# Patient Record
Sex: Male | Born: 1945 | Race: White | Hispanic: No | Marital: Married | Smoking: Former smoker
Health system: Southern US, Community
[De-identification: ages and names within clinical notes are randomized; demographics above are authoritative.]

## PROBLEM LIST (undated history)

## (undated) DIAGNOSIS — E785 Hyperlipidemia, unspecified: Secondary | ICD-10-CM

## (undated) DIAGNOSIS — I10 Essential (primary) hypertension: Secondary | ICD-10-CM

## (undated) DIAGNOSIS — I739 Peripheral vascular disease, unspecified: Secondary | ICD-10-CM

## (undated) DIAGNOSIS — M1990 Unspecified osteoarthritis, unspecified site: Secondary | ICD-10-CM

## (undated) DIAGNOSIS — K5900 Constipation, unspecified: Secondary | ICD-10-CM

## (undated) DIAGNOSIS — I219 Acute myocardial infarction, unspecified: Secondary | ICD-10-CM

## (undated) HISTORY — PX: HERNIA REPAIR: SHX51

## (undated) HISTORY — DX: Hyperlipidemia, unspecified: E78.5

## (undated) HISTORY — DX: Peripheral vascular disease, unspecified: I73.9

## (undated) HISTORY — DX: Acute myocardial infarction, unspecified: I21.9

## (undated) HISTORY — PX: OTHER SURGICAL HISTORY: SHX169

## (undated) HISTORY — DX: Essential (primary) hypertension: I10

---

## 1974-11-18 HISTORY — PX: FRACTURE SURGERY: SHX138

## 1988-11-18 HISTORY — PX: CARDIAC CATHETERIZATION: SHX172

## 2012-10-07 ENCOUNTER — Encounter (INDEPENDENT_AMBULATORY_CARE_PROVIDER_SITE_OTHER): Payer: Medicare Other

## 2012-10-07 ENCOUNTER — Other Ambulatory Visit: Payer: Self-pay | Admitting: Cardiology

## 2012-10-07 DIAGNOSIS — I739 Peripheral vascular disease, unspecified: Secondary | ICD-10-CM

## 2012-10-19 ENCOUNTER — Encounter: Payer: Self-pay | Admitting: Vascular Surgery

## 2012-10-21 ENCOUNTER — Encounter: Payer: Self-pay | Admitting: Vascular Surgery

## 2012-10-22 ENCOUNTER — Encounter: Payer: Self-pay | Admitting: Vascular Surgery

## 2012-10-22 ENCOUNTER — Ambulatory Visit (INDEPENDENT_AMBULATORY_CARE_PROVIDER_SITE_OTHER): Payer: Medicare Other | Admitting: Vascular Surgery

## 2012-10-22 VITALS — BP 128/78 | HR 52 | Resp 14 | Ht 68.0 in | Wt 151.0 lb

## 2012-10-22 DIAGNOSIS — I739 Peripheral vascular disease, unspecified: Secondary | ICD-10-CM | POA: Insufficient documentation

## 2012-10-22 DIAGNOSIS — I70219 Atherosclerosis of native arteries of extremities with intermittent claudication, unspecified extremity: Secondary | ICD-10-CM | POA: Insufficient documentation

## 2012-10-22 MED ORDER — PREGABALIN 50 MG PO CAPS: 50.0000 mg | ORAL_CAPSULE | Freq: Three times a day (TID) | ORAL | Status: DC
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
## 2012-10-28 ENCOUNTER — Encounter (HOSPITAL_COMMUNITY): Payer: Self-pay | Admitting: Pharmacy Technician

## 2012-10-29 ENCOUNTER — Other Ambulatory Visit: Payer: Self-pay

## 2012-11-06 ENCOUNTER — Telehealth: Payer: Self-pay | Admitting: Vascular Surgery

## 2012-11-06 ENCOUNTER — Ambulatory Visit (HOSPITAL_COMMUNITY)
Admission: RE | Admit: 2012-11-06 | Discharge: 2012-11-06 | Disposition: A | Discharge status: Home / Self Care | Payer: Medicare Other | Source: Ambulatory Visit | Attending: Vascular Surgery | Admitting: Vascular Surgery

## 2012-11-06 ENCOUNTER — Encounter (HOSPITAL_COMMUNITY)
Admission: RE | Disposition: A | Discharge status: Home / Self Care | Payer: Self-pay | Source: Ambulatory Visit | Attending: Vascular Surgery

## 2012-11-06 DIAGNOSIS — I70219 Atherosclerosis of native arteries of extremities with intermittent claudication, unspecified extremity: Secondary | ICD-10-CM

## 2012-11-06 DIAGNOSIS — I714 Abdominal aortic aneurysm, without rupture: Secondary | ICD-10-CM

## 2012-11-06 DIAGNOSIS — I10 Essential (primary) hypertension: Secondary | ICD-10-CM | POA: Insufficient documentation

## 2012-11-06 DIAGNOSIS — G609 Hereditary and idiopathic neuropathy, unspecified: Secondary | ICD-10-CM | POA: Insufficient documentation

## 2012-11-06 HISTORY — PX: ABDOMINAL AORTAGRAM: SHX5454

## 2012-11-06 LAB — POCT I-STAT, CHEM 8
BUN: 14 mg/dL (ref 6–23)
Chloride: 107 mEq/L (ref 96–112)
Creatinine, Ser: 1.5 mg/dL — ABNORMAL HIGH (ref 0.50–1.35)
Sodium: 142 mEq/L (ref 135–145)

## 2012-11-06 SURGERY — ABDOMINAL AORTAGRAM
Anesthesia: LOCAL

## 2012-11-06 MED ORDER — MORPHINE SULFATE 10 MG/ML IJ SOLN: 2.0000 mg | INTRAMUSCULAR | Status: DC | PRN

## 2012-11-06 MED ORDER — ACETAMINOPHEN 325 MG PO TABS: 325.0000 mg | ORAL_TABLET | ORAL | Status: DC | PRN

## 2012-11-06 MED ORDER — LIDOCAINE HCL (PF) 1 % IJ SOLN
INTRAMUSCULAR | Status: AC
  Filled 2012-11-06: qty 30

## 2012-11-06 MED ORDER — SODIUM CHLORIDE 0.9 % IV SOLN
INTRAVENOUS | Status: DC
  Administered 2012-11-06: 06:00:00 via INTRAVENOUS

## 2012-11-06 MED ORDER — METOPROLOL TARTRATE 1 MG/ML IV SOLN: 2.0000 mg | INTRAVENOUS | Status: DC | PRN

## 2012-11-06 MED ORDER — HYDRALAZINE HCL 20 MG/ML IJ SOLN: 10.0000 mg | INTRAMUSCULAR | Status: DC | PRN

## 2012-11-06 MED ORDER — ONDANSETRON HCL 4 MG/2ML IJ SOLN: 4.0000 mg | Freq: Four times a day (QID) | INTRAMUSCULAR | Status: DC | PRN

## 2012-11-06 MED ORDER — MIDAZOLAM HCL 2 MG/2ML IJ SOLN
INTRAMUSCULAR | Status: AC
  Filled 2012-11-06: qty 2

## 2012-11-06 MED ORDER — ACETAMINOPHEN 325 MG RE SUPP: 325.0000 mg | RECTAL | Status: DC | PRN

## 2012-11-06 MED ORDER — FENTANYL CITRATE 0.05 MG/ML IJ SOLN
INTRAMUSCULAR | Status: AC
  Filled 2012-11-06: qty 2

## 2012-11-06 MED ORDER — SODIUM CHLORIDE 0.45 % IV SOLN: INTRAVENOUS | Status: DC

## 2012-11-06 MED ORDER — OXYCODONE HCL 5 MG PO TABS: 5.0000 mg | ORAL_TABLET | ORAL | Status: DC | PRN

## 2012-11-06 MED ORDER — LABETALOL HCL 5 MG/ML IV SOLN: 10.0000 mg | INTRAVENOUS | Status: DC | PRN

## 2012-11-06 NOTE — Op Note (Signed)
Procedure: Aortogram with bilateral lower extremity runoff  Preoperative diagnosis: Claudication  A separate diagnosis: Same  Anesthesia: Local with IV sedation  Operative findings: #1 no significant right superficial femoral artery occlusive disease #250% stenosis left superficial femoral artery #3 two-vessel runoff to the right and left foot via a diseased peroneal and posterior tibial artery   Operative details: After informed consent, the patient was taken to the PV lab. The patient was placed in supine position on the Angio table. Both groins were prepped and draped in the usual sterile fashion. Local anesthesia was infiltrated over the rightt common femoral artery. An introducer needle was used to cannulate the right common femoral artery without difficulty. An 13 Versacore wire was then threaded and the abdominal aorta under fluoroscopic guidance. The 5 French sheath was thoroughly flushed with heparinized saline. A 5 French pigtail catheter was then placed over the guidewire and advanced into the abdominal aorta under fluoroscopic guidance. Abdominal aortogram was obtained in an AP projection. The left and right renal arteries are patent. The infrarenal abdominal aorta is irregular but no significant flow-limiting stenosis. There is some dilation and suggestion of abdominal aortic aneurysm with calcification along the lateral aspect of the infrarenal aorta. The left and right common iliac arteries are patent. The left and right external and internal iliac arteries are patent.  Next the pigtail catheter was pulled down above the aortic bifurcation and pelvic angiogram was obtained. This confirms the above findings. Bilateral lower extremity runoff views were then obtained.  In the right leg, the right common femoral and profunda femoris artery is patent. The right superficial femoral artery is irregular on its surface but no significant flow-limiting stenosis. The popliteal artery is patent. The  anterior tibial artery is occluded. There is two-vessel runoff via a very diseased peroneal artery and the posterior tibial artery. The posterior tibial artery is the primary runoff vessel to the right foot.  In the left leg, the leftt common femoral and profunda femoris artery is patent. The left superficial femoral artery is irregular on its surface there are 2 tandem stenoses new the adductor hiatus both of which are approximately 50%. The popliteal artery is patent. The anterior tibial artery is occluded. There is one-vessel runoff via a very diseased peroneal artery. The posterior tibial artery is is patent proximally but occludes just above the ankle.  Next the catheter was removed over a guidewire. The 5 French sheath was thoroughly flushed with heparinized saline. This was left in place to be pulled in the holding area. The patient tolerated the procedure well and there were no complications. Patient was taken to the holding area in stable condition.  Management plan:  The patient was advised to quit smoking.  We will obtain an aortic ultrasound today to further examine his aorta for possible AAA.  He will continue his current therapy for neuropathy.  Fabienne Bruns, MD [facility] [place] Office: [phone] Pager: [phone]

## 2012-11-06 NOTE — Interval H&P Note (Signed)
History and Physical Interval Note:  11/06/2012 7:37 AM  Nicholas Morrison  has presented today for surgery, with the diagnosis of pvd  The various methods of treatment have been discussed with the patient and family. After consideration of risks, benefits and other options for treatment, the patient has consented to  Procedure(s) (LRB) with comments: ABDOMINAL AORTAGRAM (N/A) as a surgical intervention .  The patient's history has been reviewed, patient examined, no change in status, stable for surgery.  I have reviewed the patient's chart and labs.  Questions were answered to the patient's satisfaction.     Jubilee Vivero E

## 2012-11-06 NOTE — Telephone Encounter (Addendum)
Message copied by Shari Prows on Fri Nov 06, 2012 10:51 AM ------      Message from: Melene Plan      Created: Fri Nov 06, 2012  9:14 AM                   ----- Message -----         From: Sherren Kerns, MD         Sent: 11/06/2012   8:22 AM           To: Reuel Derby, Melene Plan, RN            Aortogram with bilat runoff            Needs follow up ABI in 6 mo and appt Gillian Scarce I scheduled an appt for the above patient on 05/06/13 at 11:30am for abi's and to see NP at 12 noon. I spoke to the pt's wife regarding the appt and also mailed an appt letter. awt
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
## 2013-01-08 ENCOUNTER — Encounter: Payer: Self-pay | Admitting: Neurology

## 2013-01-08 DIAGNOSIS — F17200 Nicotine dependence, unspecified, uncomplicated: Secondary | ICD-10-CM | POA: Insufficient documentation

## 2013-01-08 DIAGNOSIS — I10 Essential (primary) hypertension: Secondary | ICD-10-CM | POA: Insufficient documentation

## 2013-03-03 ENCOUNTER — Ambulatory Visit: Payer: Self-pay | Admitting: Neurology

## 2013-04-14 ENCOUNTER — Telehealth: Payer: Self-pay | Admitting: Vascular Surgery

## 2013-04-14 NOTE — Telephone Encounter (Signed)
Called Nicholas Morrison to change the time of his appointment on 05/06/13.  His wife stated he longer will see Dr. Darrick Penna and hung up before I could question.

## 2013-05-06 ENCOUNTER — Ambulatory Visit: Payer: Medicare Other | Admitting: Neurosurgery

## 2013-05-31 IMAGING — US US AORTA
1 series · 14 of 25 positions shown · non-contrast
Comparison: None.

CLINICAL DATA: Possible abdominal aortic aneurysm noted during
cardiac catheterization.

ULTRASOUND OF ABDOMINAL AORTA
TECHNIQUE: Ultrasound examination of the abdominal aorta was
performed to evaluate for abdominal aortic aneurysm.

[Series 1: us aorta · 0.23mm/px · 14 of 25 slices shown]
[im 1/25]
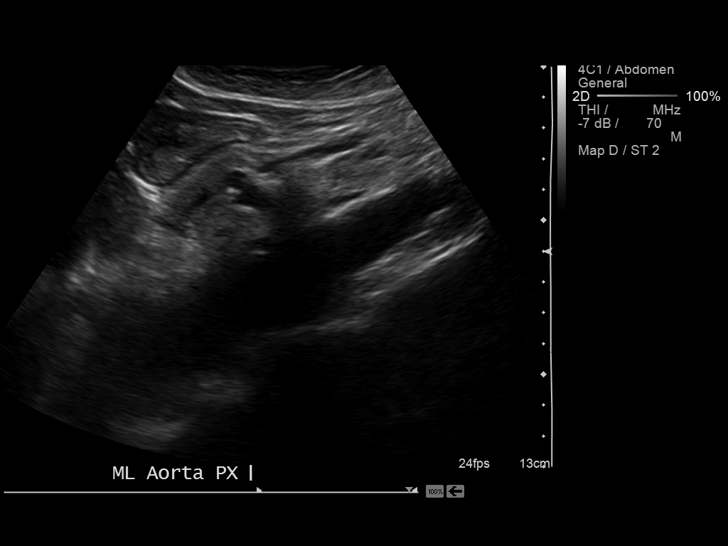
[im 3/25]
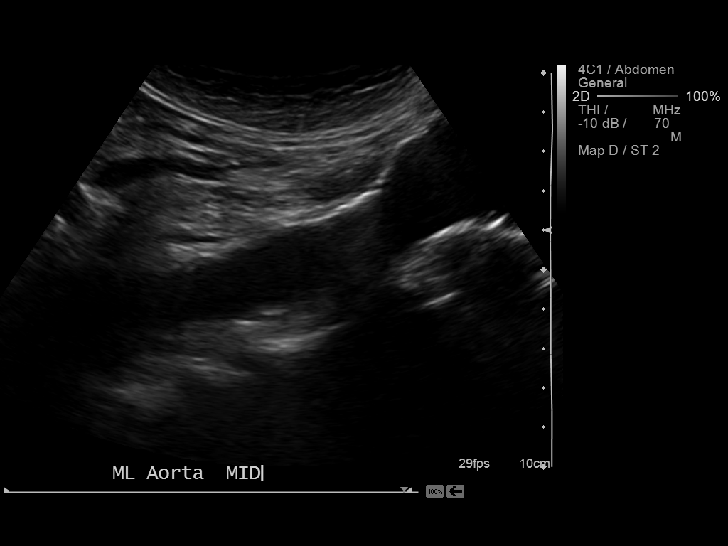
[im 5/25]
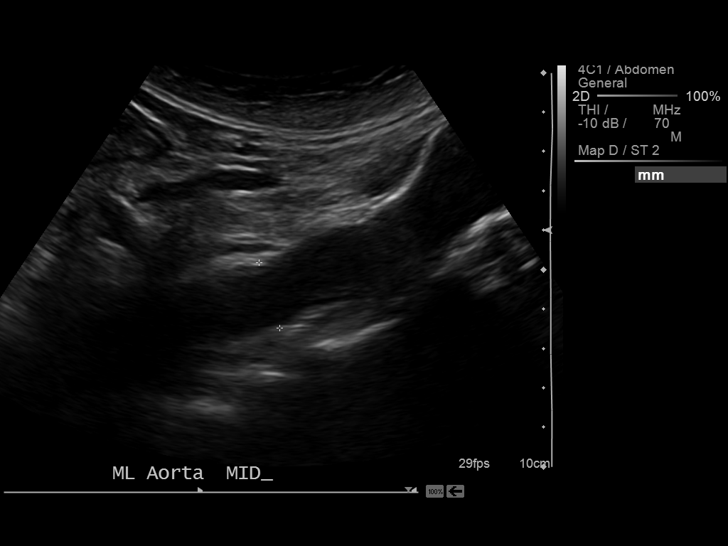
[im 7/25]
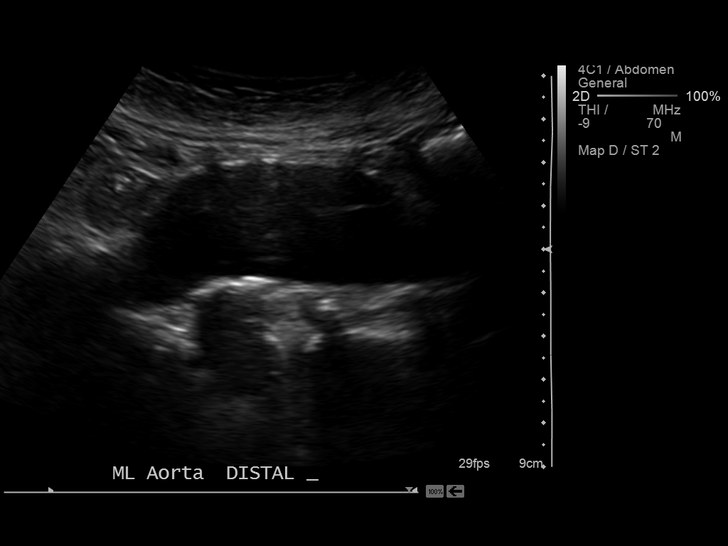
[im 9/25]
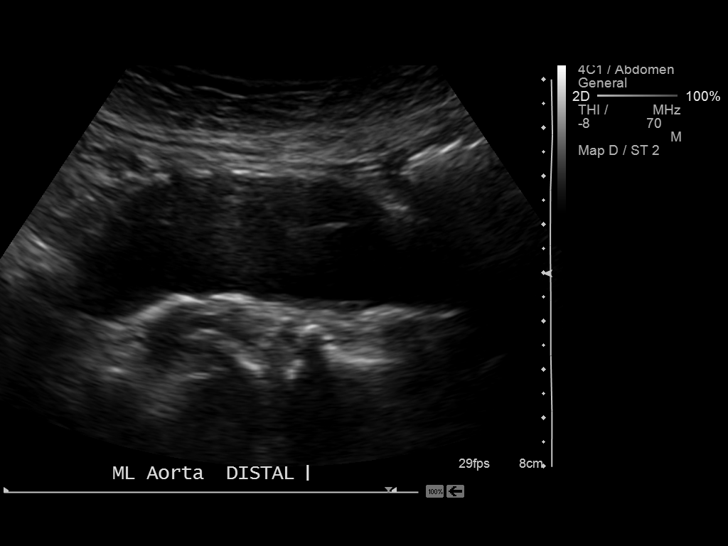
[im 10/25]
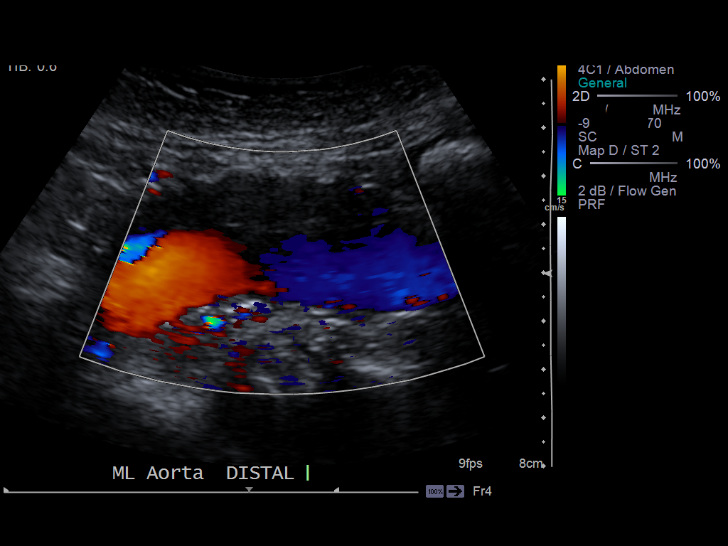
[im 12/25]
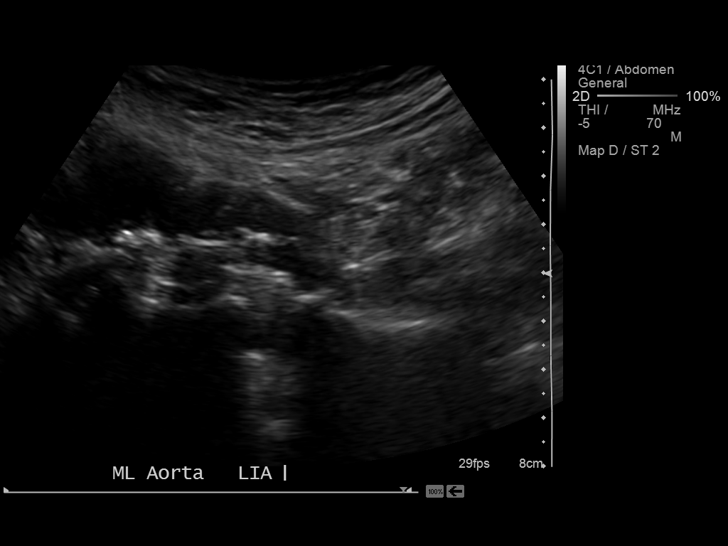
[im 14/25]
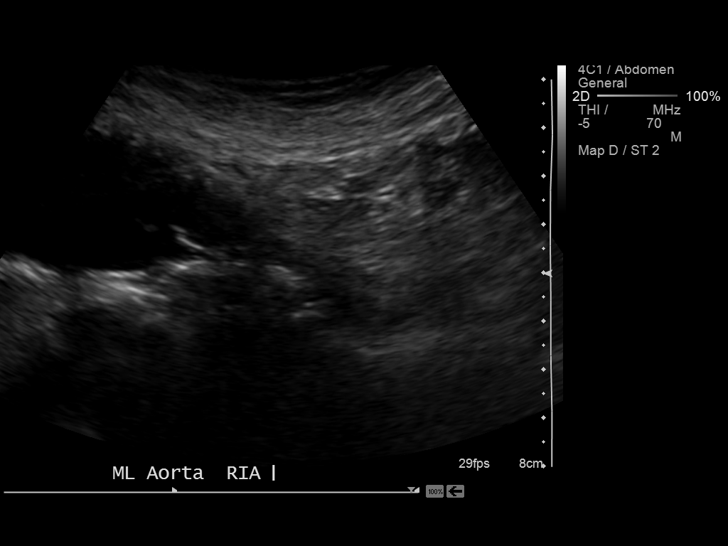
[im 16/25]
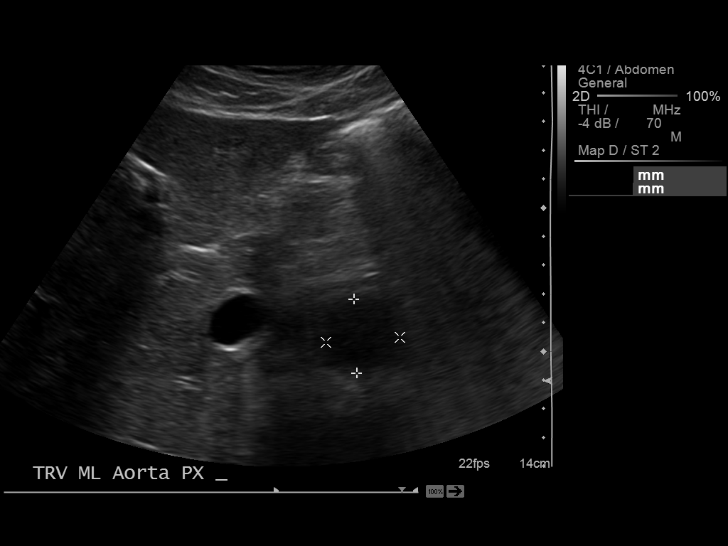
[im 17/25]
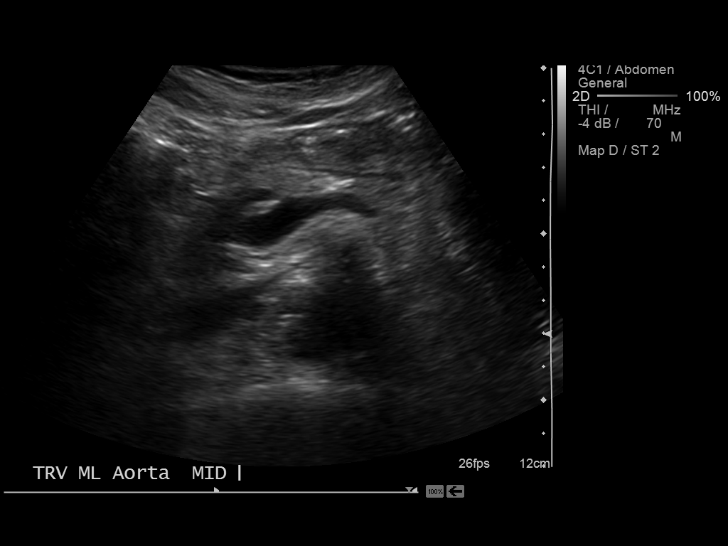
[im 19/25]
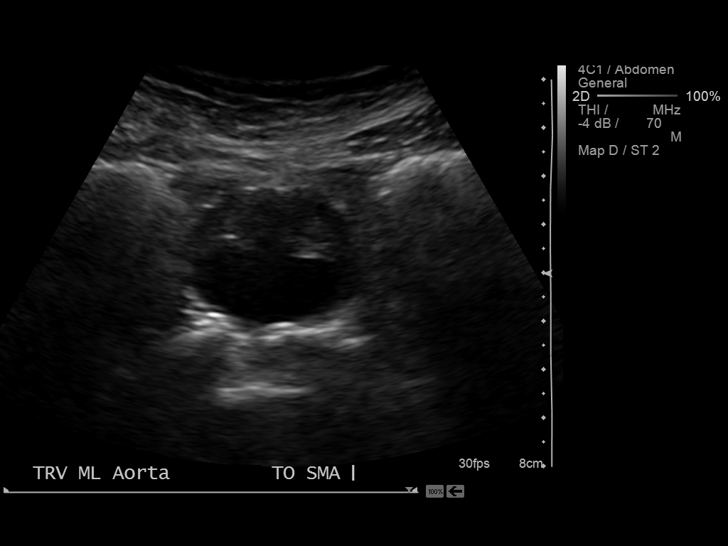
[im 21/25]
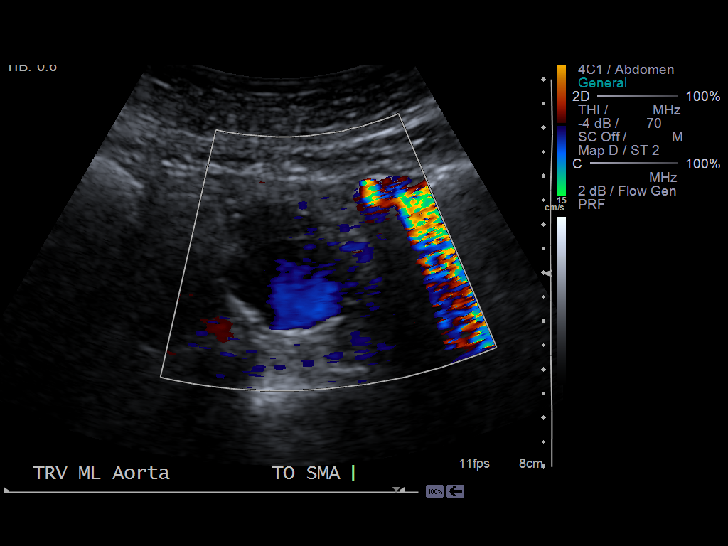
[im 23/25]
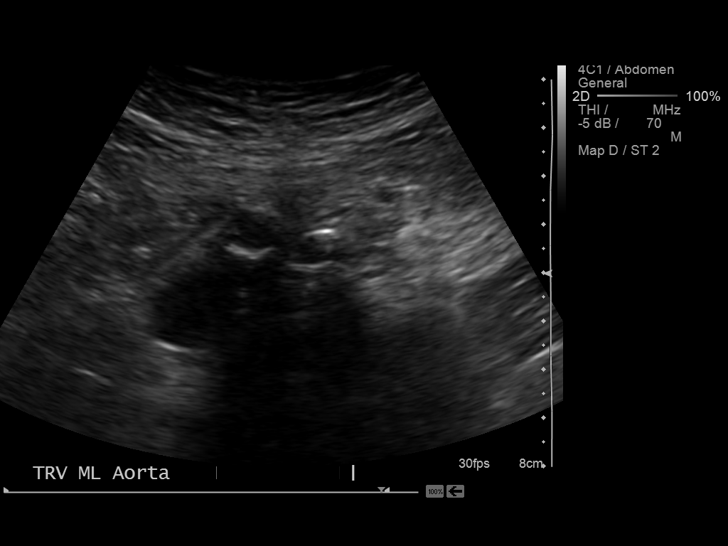
[im 25/25]
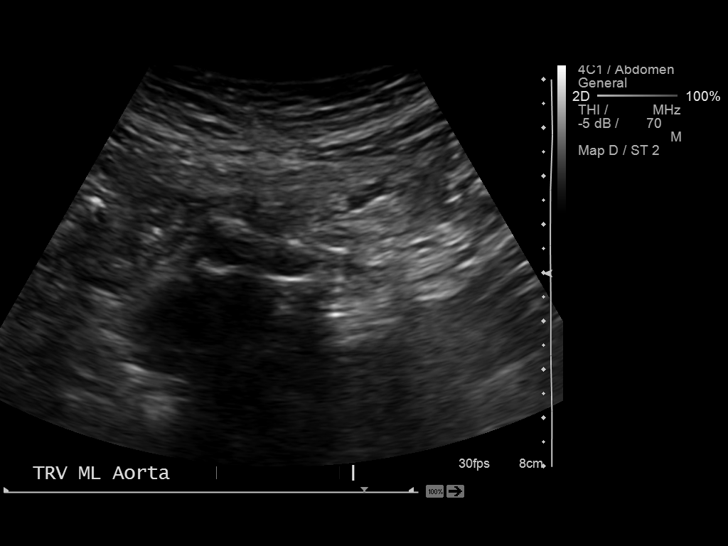

[14 of 25 positions shown; findings below may reference images not displayed]

Abdominal Aorta:  Long segment abdominal aortic aneurysm spanning
over 6.4 cm in length.  Prominent plaque along the periphery most
notable anteriorly.

      Maximum AP diameter:  3.4 cm.
      Maximum TRV diameter:  3.6 cm.

Right common iliac artery maximal dimension 1.1 cm.

Left common iliac artery maximal dimension 0.8 cm.
IMPRESSION: Long segment abdominal aortic aneurysm with maximal transverse
dimension of 3.4 x 3.6 cm with prominent plaque most notable in the
anterior aspect of the aneurysm.

This has been Myrielis Nales Perez report utilizing dashboard call
feature.

## 2014-02-23 ENCOUNTER — Encounter: Payer: Self-pay | Admitting: Family

## 2014-02-23 ENCOUNTER — Other Ambulatory Visit: Payer: Self-pay | Admitting: Vascular Surgery

## 2014-02-23 DIAGNOSIS — L98499 Non-pressure chronic ulcer of skin of other sites with unspecified severity: Principal | ICD-10-CM

## 2014-02-23 DIAGNOSIS — I739 Peripheral vascular disease, unspecified: Secondary | ICD-10-CM

## 2014-02-24 ENCOUNTER — Encounter: Payer: Self-pay | Admitting: Family

## 2014-02-24 ENCOUNTER — Other Ambulatory Visit: Payer: Self-pay | Admitting: *Deleted

## 2014-02-24 ENCOUNTER — Ambulatory Visit (INDEPENDENT_AMBULATORY_CARE_PROVIDER_SITE_OTHER): Payer: Medicare Other | Admitting: Family

## 2014-02-24 ENCOUNTER — Encounter (HOSPITAL_COMMUNITY): Payer: Self-pay | Admitting: Pharmacy Technician

## 2014-02-24 ENCOUNTER — Ambulatory Visit (HOSPITAL_COMMUNITY)
Admission: RE | Admit: 2014-02-24 | Discharge: 2014-02-24 | Disposition: A | Discharge status: Home / Self Care | Payer: Medicare Other | Source: Ambulatory Visit | Attending: Family | Admitting: Family

## 2014-02-24 ENCOUNTER — Encounter: Payer: Self-pay | Admitting: *Deleted

## 2014-02-24 VITALS — BP 115/72 | HR 72 | Temp 97.7°F | Resp 16 | Ht 68.0 in | Wt 152.0 lb

## 2014-02-24 DIAGNOSIS — I70219 Atherosclerosis of native arteries of extremities with intermittent claudication, unspecified extremity: Secondary | ICD-10-CM

## 2014-02-24 DIAGNOSIS — I7025 Atherosclerosis of native arteries of other extremities with ulceration: Secondary | ICD-10-CM | POA: Insufficient documentation

## 2014-02-24 DIAGNOSIS — I739 Peripheral vascular disease, unspecified: Secondary | ICD-10-CM

## 2014-02-24 DIAGNOSIS — L98499 Non-pressure chronic ulcer of skin of other sites with unspecified severity: Secondary | ICD-10-CM
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
## 2014-02-24 NOTE — Patient Instructions (Signed)
Peripheral Vascular Disease Peripheral Vascular Disease (PVD), also called Peripheral Arterial Disease (PAD), is a circulation problem caused by cholesterol (atherosclerotic plaque) deposits in the arteries. PVD commonly occurs in the lower extremities (legs) but it can occur in other areas of the body, such as your arms. The cholesterol buildup in the arteries reduces blood flow which can cause pain and other serious problems. The presence of PVD can place a person at risk for Coronary Artery Disease (CAD).  CAUSES  Causes of PVD can be many. It is usually associated with more than one risk factor such as:   High Cholesterol.  Smoking.  Diabetes.  Lack of exercise or inactivity.  High blood pressure (hypertension).  Obesity.  Family history. SYMPTOMS   When the lower extremities are affected, patients with PVD may experience:  Leg pain with exertion or physical activity. This is called INTERMITTENT CLAUDICATION. This may present as cramping or numbness with physical activity. The location of the pain is associated with the level of blockage. For example, blockage at the abdominal level (distal abdominal aorta) may result in buttock or hip pain. Lower leg arterial blockage may result in calf pain.  As PVD becomes more severe, pain can develop with less physical activity.  In people with severe PVD, leg pain may occur at rest.  Other PVD signs and symptoms:  Leg numbness or weakness.  Coldness in the affected leg or foot, especially when compared to the other leg.  A change in leg color.  Patients with significant PVD are more prone to ulcers or sores on toes, feet or legs. These may take longer to heal or may reoccur. The ulcers or sores can become infected.  If signs and symptoms of PVD are ignored, gangrene may occur. This can result in the loss of toes or loss of an entire limb.  Not all leg pain is related to PVD. Other medical conditions can cause leg pain such  as:  Blood clots (embolism) or Deep Vein Thrombosis.  Inflammation of the blood vessels (vasculitis).  Spinal stenosis. DIAGNOSIS  Diagnosis of PVD can involve several different types of tests. These can include:  Pulse Volume Recording Method (PVR). This test is simple, painless and does not involve the use of X-rays. PVR involves measuring and comparing the blood pressure in the arms and legs. An ABI (Ankle-Brachial Index) is calculated. The normal ratio of blood pressures is 1. As this number becomes smaller, it indicates more severe disease.  < 0.95  indicates significant narrowing in one or more leg vessels.  <0.8 there will usually be pain in the foot, leg or buttock with exercise.  <0.4 will usually have pain in the legs at rest.  <0.25  usually indicates limb threatening PVD.  Doppler detection of pulses in the legs. This test is painless and checks to see if you have a pulses in your legs/feet.  A dye or contrast material (a substance that highlights the blood vessels so they show up on x-ray) may be given to help your caregiver better see the arteries for the following tests. The dye is eliminated from your body by the kidney's. Your caregiver may order blood work to check your kidney function and other laboratory values before the following tests are performed:  Magnetic Resonance Angiography (MRA). An MRA is a picture study of the blood vessels and arteries. The MRA machine uses a large magnet to produce images of the blood vessels.  Computed Tomography Angiography (CTA). A CTA is a   specialized x-ray that looks at how the blood flows in your blood vessels. An IV may be inserted into your arm so contrast dye can be injected.  Angiogram. Is a procedure that uses x-rays to look at your blood vessels. This procedure is minimally invasive, meaning a small incision (cut) is made in your groin. A small tube (catheter) is then inserted into the artery of your groin. The catheter is  guided to the blood vessel or artery your caregiver wants to examine. Contrast dye is injected into the catheter. X-rays are then taken of the blood vessel or artery. After the images are obtained, the catheter is taken out. TREATMENT  Treatment of PVD involves many interventions which may include:  Lifestyle changes:  Quitting smoking.  Exercise.  Following a low fat, low cholesterol diet.  Control of diabetes.  Foot care is very important to the PVD patient. Good foot care can help prevent infection.  Medication:  Cholesterol-lowering medicine.  Blood pressure medicine.  Anti-platelet drugs.  Certain medicines may reduce symptoms of Intermittent Claudication.  Interventional/Surgical options:  Angioplasty. An Angioplasty is a procedure that inflates a balloon in the blocked artery. This opens the blocked artery to improve blood flow.  Stent Implant. A wire mesh tube (stent) is placed in the artery. The stent expands and stays in place, allowing the artery to remain open.  Peripheral Bypass Surgery. This is a surgical procedure that reroutes the blood around a blocked artery to help improve blood flow. This type of procedure may be performed if Angioplasty or stent implants are not an option. SEEK IMMEDIATE MEDICAL CARE IF:   You develop pain or numbness in your arms or legs.  Your arm or leg turns cold, becomes blue in color.  You develop redness, warmth, swelling and pain in your arms or legs. MAKE SURE YOU:   Understand these instructions.  Will watch your condition.  Will get help right away if you are not doing well or get worse. Document Released: 12/12/2004 Document Revised: 01/27/2012 Document Reviewed: 11/08/2008 ExitCare Patient Information 2014 [employer].   Smoking Cessation Quitting smoking is important to your health and has many advantages. However, it is not always easy to quit since nicotine is a very addictive drug. Often times, people try 3  times or more before being able to quit. This document explains the best ways for you to prepare to quit smoking. Quitting takes hard work and a lot of effort, but you can do it. ADVANTAGES OF QUITTING SMOKING  You will live longer, feel better, and live better.  Your body will feel the impact of quitting smoking almost immediately.  Within 20 minutes, blood pressure decreases. Your pulse returns to its normal level.  After 8 hours, carbon monoxide levels in the blood return to normal. Your oxygen level increases.  After 24 hours, the chance of having a heart attack starts to decrease. Your breath, hair, and body stop smelling like smoke.  After 48 hours, damaged nerve endings begin to recover. Your sense of taste and smell improve.  After 72 hours, the body is virtually free of nicotine. Your bronchial tubes relax and breathing becomes easier.  After 2 to 12 weeks, lungs can hold more air. Exercise becomes easier and circulation improves.  The risk of having a heart attack, stroke, cancer, or lung disease is greatly reduced.  After 1 year, the risk of coronary heart disease is cut in half.  After 5 years, the risk of stroke falls to   the same as a nonsmoker.  After 10 years, the risk of lung cancer is cut in half and the risk of other cancers decreases significantly.  After 15 years, the risk of coronary heart disease drops, usually to the level of a nonsmoker.  If you are pregnant, quitting smoking will improve your chances of having a healthy baby.  The people you live with, especially any children, will be healthier.  You will have extra money to spend on things other than cigarettes. QUESTIONS TO THINK ABOUT BEFORE ATTEMPTING TO QUIT You may want to talk about your answers with your caregiver.  Why do you want to quit?  If you tried to quit in the past, what helped and what did not?  What will be the most difficult situations for you after you quit? How will you plan to  handle them?  Who can help you through the tough times? Your family? Friends? A caregiver?  What pleasures do you get from smoking? What ways can you still get pleasure if you quit? Here are some questions to ask your caregiver:  How can you help me to be successful at quitting?  What medicine do you think would be best for me and how should I take it?  What should I do if I need more help?  What is smoking withdrawal like? How can I get information on withdrawal? GET READY  Set a quit date.  Change your environment by getting rid of all cigarettes, ashtrays, matches, and lighters in your home, car, or work. Do not let people smoke in your home.  Review your past attempts to quit. Think about what worked and what did not. GET SUPPORT AND ENCOURAGEMENT You have a better chance of being successful if you have help. You can get support in many ways.  Tell your family, friends, and co-workers that you are going to quit and need their support. Ask them not to smoke around you.  Get individual, group, or telephone counseling and support. Programs are available at local hospitals and health centers. Call your local health department for information about programs in your area.  Spiritual beliefs and practices may help some smokers quit.  Download a "quit meter" on your computer to keep track of quit statistics, such as how long you have gone without smoking, cigarettes not smoked, and money saved.  Get a self-help book about quitting smoking and staying off of tobacco. LEARN NEW SKILLS AND BEHAVIORS  Distract yourself from urges to smoke. Talk to someone, go for a walk, or occupy your time with a task.  Change your normal routine. Take a different route to work. Drink tea instead of coffee. Eat breakfast in a different place.  Reduce your stress. Take a hot bath, exercise, or read a book.  Plan something enjoyable to do every day. Reward yourself for not smoking.  Explore  interactive web-based programs that specialize in helping you quit. GET MEDICINE AND USE IT CORRECTLY Medicines can help you stop smoking and decrease the urge to smoke. Combining medicine with the above behavioral methods and support can greatly increase your chances of successfully quitting smoking.  Nicotine replacement therapy helps deliver nicotine to your body without the negative effects and risks of smoking. Nicotine replacement therapy includes nicotine gum, lozenges, inhalers, nasal sprays, and skin patches. Some may be available over-the-counter and others require a prescription.  Antidepressant medicine helps people abstain from smoking, but how this works is unknown. This medicine is available by prescription.    Nicotinic receptor partial agonist medicine simulates the effect of nicotine in your brain. This medicine is available by prescription. Ask your caregiver for advice about which medicines to use and how to use them based on your health history. Your caregiver will tell you what side effects to look out for if you choose to be on a medicine or therapy. Carefully read the information on the package. Do not use any other product containing nicotine while using a nicotine replacement product.  RELAPSE OR DIFFICULT SITUATIONS Most relapses occur within the first 3 months after quitting. Do not be discouraged if you start smoking again. Remember, most people try several times before finally quitting. You may have symptoms of withdrawal because your body is used to nicotine. You may crave cigarettes, be irritable, feel very hungry, cough often, get headaches, or have difficulty concentrating. The withdrawal symptoms are only temporary. They are strongest when you first quit, but they will go away within 10 14 days. To reduce the chances of relapse, try to:  Avoid drinking alcohol. Drinking lowers your chances of successfully quitting.  Reduce the amount of caffeine you consume. Once you  quit smoking, the amount of caffeine in your body increases and can give you symptoms, such as a rapid heartbeat, sweating, and anxiety.  Avoid smokers because they can make you want to smoke.  Do not let weight gain distract you. Many smokers will gain weight when they quit, usually less than 10 pounds. Eat a healthy diet and stay active. You can always lose the weight gained after you quit.  Find ways to improve your mood other than smoking. FOR MORE INFORMATION  www.smokefree.gov  Document Released: 10/29/2001 Document Revised: 05/05/2012 Document Reviewed: 02/13/2012 [employer] Patient Information 2014 [employer].  

## 2014-02-27 ENCOUNTER — Encounter (HOSPITAL_COMMUNITY): Payer: Self-pay | Admitting: Emergency Medicine

## 2014-02-27 ENCOUNTER — Emergency Department (HOSPITAL_COMMUNITY): Payer: Medicare Other

## 2014-02-27 DIAGNOSIS — I739 Peripheral vascular disease, unspecified: Secondary | ICD-10-CM | POA: Insufficient documentation

## 2014-02-27 DIAGNOSIS — E875 Hyperkalemia: Secondary | ICD-10-CM | POA: Insufficient documentation

## 2014-02-27 DIAGNOSIS — T370X5A Adverse effect of sulfonamides, initial encounter: Secondary | ICD-10-CM | POA: Insufficient documentation

## 2014-02-27 DIAGNOSIS — L97509 Non-pressure chronic ulcer of other part of unspecified foot with unspecified severity: Secondary | ICD-10-CM | POA: Insufficient documentation

## 2014-02-27 DIAGNOSIS — I252 Old myocardial infarction: Secondary | ICD-10-CM | POA: Insufficient documentation

## 2014-02-27 DIAGNOSIS — Z7982 Long term (current) use of aspirin: Secondary | ICD-10-CM | POA: Insufficient documentation

## 2014-02-27 DIAGNOSIS — I10 Essential (primary) hypertension: Secondary | ICD-10-CM | POA: Insufficient documentation

## 2014-02-27 DIAGNOSIS — Z79899 Other long term (current) drug therapy: Secondary | ICD-10-CM | POA: Insufficient documentation

## 2014-02-27 DIAGNOSIS — F17200 Nicotine dependence, unspecified, uncomplicated: Secondary | ICD-10-CM | POA: Insufficient documentation

## 2014-02-27 DIAGNOSIS — R609 Edema, unspecified: Secondary | ICD-10-CM | POA: Insufficient documentation

## 2014-02-27 DIAGNOSIS — L519 Erythema multiforme, unspecified: Secondary | ICD-10-CM | POA: Insufficient documentation

## 2014-02-27 DIAGNOSIS — E785 Hyperlipidemia, unspecified: Secondary | ICD-10-CM | POA: Insufficient documentation

## 2014-02-27 DIAGNOSIS — Z9889 Other specified postprocedural states: Secondary | ICD-10-CM | POA: Insufficient documentation

## 2014-02-27 NOTE — ED Notes (Signed)
The pt has rt foot problems since February when his  Rt great toe was trimmed causing infection.  His foot has been dressed  x2 each day for awhile.  Tonight the pt has ahd more pain and his wife  Reports a significant change in the foot color plus there is redness extending up the leg that was not there this am at 1000 when last dressed.  The pt is not a diabetic

## 2014-02-28 ENCOUNTER — Emergency Department (HOSPITAL_COMMUNITY)
Admission: EM | Admit: 2014-02-28 | Discharge: 2014-02-28 | Disposition: A | Discharge status: Home / Self Care | Payer: Medicare Other | Attending: Emergency Medicine | Admitting: Emergency Medicine

## 2014-02-28 DIAGNOSIS — L519 Erythema multiforme, unspecified: Secondary | ICD-10-CM

## 2014-02-28 DIAGNOSIS — T7840XA Allergy, unspecified, initial encounter: Secondary | ICD-10-CM

## 2014-02-28 DIAGNOSIS — E875 Hyperkalemia: Secondary | ICD-10-CM

## 2014-02-28 LAB — BASIC METABOLIC PANEL
BUN: 19 mg/dL (ref 6–23)
CALCIUM: 9 mg/dL (ref 8.4–10.5)
CO2: 24 mEq/L (ref 19–32)
CREATININE: 1.31 mg/dL (ref 0.50–1.35)
Chloride: 101 mEq/L (ref 96–112)
GFR, EST AFRICAN AMERICAN: 63 mL/min — AB (ref >90)
GFR, EST NON AFRICAN AMERICAN: 54 mL/min — AB (ref >90)
Glucose, Bld: 93 mg/dL (ref 70–99)
Potassium: 5.9 mEq/L — ABNORMAL HIGH (ref 3.7–5.3)
Sodium: 137 mEq/L (ref 137–147)

## 2014-02-28 LAB — CBC WITH DIFFERENTIAL/PLATELET
Basophils Absolute: 0 10*3/uL (ref 0.0–0.1)
Basophils Relative: 0 % (ref 0–1)
Eosinophils Absolute: 0.2 10*3/uL (ref 0.0–0.7)
Eosinophils Relative: 2 % (ref 0–5)
HEMATOCRIT: 41.6 % (ref 39.0–52.0)
HEMOGLOBIN: 14.7 g/dL (ref 13.0–17.0)
LYMPHS ABS: 2 10*3/uL (ref 0.7–4.0)
Lymphocytes Relative: 23 % (ref 12–46)
MCH: 34.4 pg — ABNORMAL HIGH (ref 26.0–34.0)
MCHC: 35.3 g/dL (ref 30.0–36.0)
MCV: 97.4 fL (ref 78.0–100.0)
MONO ABS: 0.9 10*3/uL (ref 0.1–1.0)
MONOS PCT: 11 % (ref 3–12)
NEUTROS ABS: 5.4 10*3/uL (ref 1.7–7.7)
Neutrophils Relative %: 64 % (ref 43–77)
Platelets: 236 10*3/uL (ref 150–400)
RBC: 4.27 MIL/uL (ref 4.22–5.81)
RDW: 12.3 % (ref 11.5–15.5)
WBC: 8.4 10*3/uL (ref 4.0–10.5)

## 2014-02-28 LAB — PROTIME-INR
INR: 1.04 (ref 0.00–1.49)
Prothrombin Time: 13.4 seconds (ref 11.6–15.2)

## 2014-02-28 LAB — SEDIMENTATION RATE: SED RATE: 30 mm/h — AB (ref 0–16)

## 2014-02-28 MED ORDER — ONDANSETRON HCL 4 MG/2ML IJ SOLN: 4.0000 mg | Freq: Once | INTRAMUSCULAR | Status: DC

## 2014-02-28 MED ORDER — OXYCODONE-ACETAMINOPHEN 5-325 MG PO TABS: 1.0000 | ORAL_TABLET | ORAL | Status: DC | PRN

## 2014-02-28 MED ORDER — HYDROCODONE-ACETAMINOPHEN 5-325 MG PO TABS
2.0000 | ORAL_TABLET | Freq: Once | ORAL | Status: DC
  Filled 2014-02-28: qty 2

## 2014-02-28 MED ORDER — OXYCODONE-ACETAMINOPHEN 5-325 MG PO TABS
1.0000 | ORAL_TABLET | Freq: Once | ORAL | Status: AC
  Administered 2014-02-28: 1 via ORAL
  Filled 2014-02-28: qty 1

## 2014-02-28 NOTE — Discharge Instructions (Signed)
Please discontinue your bactrim immediately. Follow up with your podiatrist and let him know that you were instructed to stop this medication. Please make him aware of your rash. Continue to change your bandages as directed. Your potassium was slightly elevated. This is likely due to the bactrim as well.    Drug Allergy Allergic reactions to medicines are common. Some allergic reactions are mild. A delayed type of drug allergy that occurs 1 week or more after exposure to a medicine or vaccine is called serum sickness. A life-threatening, sudden (acute) allergic reaction that involves the whole body is called anaphylaxis. CAUSES  "True" drug allergies occur when there is an allergic reaction to a medicine. This is caused by overactivity of the immune system. First, the body becomes sensitized. The immune system is triggered by your first exposure to the medicine. Following this first exposure, future exposure to the same medicine may be life-threatening. Almost any medicine can cause an allergic reaction. Common ones are:  Penicillin.  Sulfonamides (sulfa drugs).  Local anesthetics.  X-ray dyes that contain iodine. SYMPTOMS  Common symptoms of a minor allergic reaction are:  Swelling around the mouth.  An itchy red rash or hives.  Vomiting or diarrhea. Anaphylaxis can cause swelling of the mouth and throat. This makes it difficult to breathe and swallow. Severe reactions can be fatal within seconds, even after exposure to only a trace amount of the drug that causes the reaction. HOME CARE INSTRUCTIONS   If you are unsure of what caused your reaction, keep a diary of foods and medicines used. Include the symptoms that followed. Avoid anything that causes reactions.  You may want to follow up with an allergy specialist after the reaction has cleared in order to be tested to confirm the allergy. It is important to confirm that your reaction is an allergy, not just a side effect to the  medicine. If you have a true allergy to a medicine, this may prevent that medicine and related medicines from being given to you when you are very ill.  If you have hives or a rash:  Take medicines as directed by your caregiver.  You may use an over-the-counter antihistamine (diphenhydramine) as needed.  Apply cold compresses to the skin or take baths in cool water. Avoid hot baths or showers.  If you are severely allergic:  Continuous observation after a severe reaction may be needed. Hospitalization is often required.  Wear a medical alert bracelet or necklace stating your allergy.  You and your family must learn how to use an anaphylaxis kit or give an epinephrine injection to temporarily treat an emergency allergic reaction. If you have had a severe reaction, always carry your epinephrine injection or anaphylaxis kit with you. This can be lifesaving if you have a severe reaction.  Do not drive or perform tasks after treatment until the medicines used to treat your reaction have worn off, or until your caregiver says it is okay. SEEK MEDICAL CARE IF:   You think you had an allergic reaction. Symptoms usually start within 30 minutes after exposure.  Symptoms are getting worse rather than better.  You develop new symptoms.  The symptoms that brought you to your caregiver return. SEEK IMMEDIATE MEDICAL CARE IF:   You have swelling of the mouth, difficulty breathing, or wheezing.  You have a tight feeling in your chest or throat.  You develop hives, swelling, or itching all over your body.  You develop severe vomiting or diarrhea.  You feel  faint or pass out. This is an emergency. Use your epinephrine injection or anaphylaxis kit as you have been instructed. Call for emergency medical help. Even if you improve after the injection, you need to be examined at a hospital emergency department. MAKE SURE YOU:   Understand these instructions.  Will watch your condition.  Will  get help right away if you are not doing well or get worse. Document Released: 11/04/2005 Document Revised: 01/27/2012 Document Reviewed: 04/10/2011 [facility] Patient Information 2014 [place], [place].  Erythema Multiforme Erythema multiforme (EM) is a rash that occurs mostly on the skin. Sometimes it occurs on the lips and mouth. It is usually a mild illness that goes away on its own. It usually affects young adults in the spring and fall. It tends to be recurrent with each episode lasting 1 to 4 weeks. CAUSES  The cause of EM may be an overreaction by the body's immune system to a trigger (something that causes the body to react).  Common triggers include:  Infections, including:  Viruses.  Bacteria.  Fungi.  Parasites.  Medicines. Less common triggers include:  Foods.  Chemicals.  Injuries to the skin.  Pregnancy.  Other illnesses. In some cases the cause may not be known. SYMPTOMS  The rash from EM shows up suddenly. The rash may appear days after the trigger. It may start as small, red, round or oval marks that become bumps or raised welts over 24 to 48 hours. These can spread and be quite large (about one inch [several centimeters]). These skin changes usually appear first on the backs of the hands, then spread to the tops of the feet, arms, elbows, knees, palms and soles. There may be a mild rash on the lips and lining of the mouth. The skin rash may show up in waves over a few days. There may be mild itching or burning of the skin at first. It may take up to 4 weeks to go away. The rash may come back again at a later time. DIAGNOSIS  Diagnosis of EM is usually made by physical exam. Sometimes a skin biopsy is done if the diagnosis is not certain. A skin biopsy is the removal of a small piece of tissue which can be examined under a microscope by a specialist (pathologist). TREATMENT  Most episodes of EM heal on their own and treatment may not be needed. If possible, it is  best to remove the trigger or treat the infection. If your trigger is a herpes virus infection (cold sore), use sunscreen lotion and sunscreen-containing lip balm to prevent sunlight triggered outbreaks of herpes virus. Medicine for itching may be given. Medicines can be used for severe cases and to prevent repeat bouts of EM.  HOME CARE INSTRUCTIONS   If possible, avoid known triggers.  If a medicine was your trigger, be sure to notify all of your caregivers. You should avoid this medicine or any like it in the future. SEEK MEDICAL CARE IF:   Your EM rash shows up again in the future SEEK IMMEDIATE MEDICAL CARE IF:   Red, swollen lips or mouth develop.  Burning feeling in the mouth or lips.  Blisters or open sores in the mouth, lips, vagina, penis or anus.  Eye pain, redness or drainage.  Blisters on the skin.  Difficulty breathing.  Difficulty swallowing; drooling.  Blood in urine.  Pain with urinating. Document Released: 11/04/2005 Document Revised: 01/27/2012 Document Reviewed: 10/21/2008 [facility] Patient Information 2014 [place], [place].

## 2014-02-28 NOTE — ED Notes (Signed)
Dr Otter at bedside  

## 2014-02-28 NOTE — ED Notes (Signed)
Pt reports his wife will drive him home.
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
## 2014-03-01 MED ORDER — SODIUM CHLORIDE 0.9 % IV SOLN
INTRAVENOUS | Status: DC
  Administered 2014-03-02: 100 mL/h via INTRAVENOUS

## 2014-03-02 ENCOUNTER — Other Ambulatory Visit: Payer: Self-pay

## 2014-03-02 ENCOUNTER — Encounter (HOSPITAL_COMMUNITY)
Admission: RE | Disposition: A | Discharge status: Home / Self Care | Payer: Self-pay | Source: Ambulatory Visit | Attending: Vascular Surgery

## 2014-03-02 ENCOUNTER — Ambulatory Visit (HOSPITAL_COMMUNITY)
Admission: RE | Admit: 2014-03-02 | Discharge: 2014-03-02 | Disposition: A | Discharge status: Home / Self Care | Payer: Medicare Other | Source: Ambulatory Visit | Attending: Vascular Surgery | Admitting: Vascular Surgery

## 2014-03-02 DIAGNOSIS — I252 Old myocardial infarction: Secondary | ICD-10-CM | POA: Insufficient documentation

## 2014-03-02 DIAGNOSIS — L97509 Non-pressure chronic ulcer of other part of unspecified foot with unspecified severity: Secondary | ICD-10-CM | POA: Insufficient documentation

## 2014-03-02 DIAGNOSIS — I70269 Atherosclerosis of native arteries of extremities with gangrene, unspecified extremity: Secondary | ICD-10-CM | POA: Insufficient documentation

## 2014-03-02 DIAGNOSIS — I739 Peripheral vascular disease, unspecified: Secondary | ICD-10-CM

## 2014-03-02 DIAGNOSIS — L98499 Non-pressure chronic ulcer of skin of other sites with unspecified severity: Secondary | ICD-10-CM

## 2014-03-02 DIAGNOSIS — Z01810 Encounter for preprocedural cardiovascular examination: Secondary | ICD-10-CM

## 2014-03-02 DIAGNOSIS — F17200 Nicotine dependence, unspecified, uncomplicated: Secondary | ICD-10-CM | POA: Insufficient documentation

## 2014-03-02 DIAGNOSIS — E785 Hyperlipidemia, unspecified: Secondary | ICD-10-CM | POA: Insufficient documentation

## 2014-03-02 DIAGNOSIS — Z7982 Long term (current) use of aspirin: Secondary | ICD-10-CM | POA: Insufficient documentation

## 2014-03-02 DIAGNOSIS — I10 Essential (primary) hypertension: Secondary | ICD-10-CM | POA: Insufficient documentation

## 2014-03-02 HISTORY — PX: ABDOMINAL AORTAGRAM: SHX5454

## 2014-03-02 LAB — POCT I-STAT, CHEM 8
BUN: 14 mg/dL (ref 6–23)
CHLORIDE: 101 meq/L (ref 96–112)
CREATININE: 1.2 mg/dL (ref 0.50–1.35)
Calcium, Ion: 1.19 mmol/L (ref 1.13–1.30)
GLUCOSE: 87 mg/dL (ref 70–99)
HEMATOCRIT: 49 % (ref 39.0–52.0)
Hemoglobin: 16.7 g/dL (ref 13.0–17.0)
Potassium: 4.2 mEq/L (ref 3.7–5.3)
Sodium: 142 mEq/L (ref 137–147)
TCO2: 27 mmol/L (ref 0–100)

## 2014-03-02 SURGERY — ABDOMINAL AORTAGRAM
Anesthesia: LOCAL

## 2014-03-02 MED ORDER — HYDRALAZINE HCL 20 MG/ML IJ SOLN: 10.0000 mg | INTRAMUSCULAR | Status: DC | PRN

## 2014-03-02 MED ORDER — OXYCODONE HCL 5 MG PO TABS: 5.0000 mg | ORAL_TABLET | ORAL | Status: DC | PRN

## 2014-03-02 MED ORDER — LIDOCAINE HCL (PF) 1 % IJ SOLN
INTRAMUSCULAR | Status: AC
  Filled 2014-03-02: qty 30

## 2014-03-02 MED ORDER — ONDANSETRON HCL 4 MG/2ML IJ SOLN: 4.0000 mg | Freq: Four times a day (QID) | INTRAMUSCULAR | Status: DC | PRN

## 2014-03-02 MED ORDER — METOPROLOL TARTRATE 1 MG/ML IV SOLN: 2.0000 mg | INTRAVENOUS | Status: DC | PRN

## 2014-03-02 MED ORDER — DOCUSATE SODIUM 100 MG PO CAPS: 100.0000 mg | ORAL_CAPSULE | Freq: Every day | ORAL | Status: DC

## 2014-03-02 MED ORDER — SODIUM CHLORIDE 0.45 % IV SOLN
INTRAVENOUS | Status: DC
Start: 2014-03-02 — End: 2014-03-02
  Administered 2014-03-02: 17:00:00 via INTRAVENOUS

## 2014-03-02 MED ORDER — LABETALOL HCL 5 MG/ML IV SOLN: 10.0000 mg | INTRAVENOUS | Status: DC | PRN

## 2014-03-02 MED ORDER — MORPHINE SULFATE 10 MG/ML IJ SOLN: 2.0000 mg | INTRAMUSCULAR | Status: DC | PRN

## 2014-03-02 MED ORDER — HEPARIN (PORCINE) IN NACL 2-0.9 UNIT/ML-% IJ SOLN
INTRAMUSCULAR | Status: AC
  Filled 2014-03-02: qty 1500

## 2014-03-02 MED ORDER — ACETAMINOPHEN 325 MG PO TABS: 325.0000 mg | ORAL_TABLET | ORAL | Status: DC | PRN

## 2014-03-02 MED ORDER — FENTANYL CITRATE 0.05 MG/ML IJ SOLN
INTRAMUSCULAR | Status: AC
  Filled 2014-03-02: qty 2

## 2014-03-02 MED ORDER — ACETAMINOPHEN 325 MG RE SUPP: 325.0000 mg | RECTAL | Status: DC | PRN
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
## 2014-03-02 NOTE — Op Note (Signed)
Procedure: Abdominal aortogram with bilateral lower extremity runoff  Preoperative diagnosis: Gangrene right foot  Postoperative diagnosis: Same  Anesthesia: Local with IV sedation  Operative findings: #1 severe tibial artery occlusive disease bilaterally, posterior tibial peroneal and anterior tibial runoff to the right foot reconstituted mid leg #2 similar findings left leg  Operative details: After obtaining informed consent, the patient was taken to the PV lab. The patient was placed in supine position the Angio table. Both groins were prepped and draped in usual sterile fashion. Local anesthesia was insured of the left common femoral artery. Ultrasound was used to identify the left common femoral artery. An introducer needle was used to cannulate the left common femoral artery without difficulty. An 035 first core wire was then threaded up and the abdominal aorta under fluoroscopic guidance. A 5 French sheath was placed over the guidewire the left common femoral artery. The sheath was thoroughly flushed with heparinized saline. A 5 French pigtail catheter was placed over the guidewire up into the abdominal aorta. An abdominal aortogram was obtained. The infrarenal abdominal aorta is ectatic. It is patent. The left and right renal arteries are patent. The left and right common and external iliac arteries are patent. The internal iliac arteries are patent bilaterally but diseased and multiple areas of stenosis distally.  Next the pigtail catheter was pulled down just above the aortic bifurcation and pelvic angiogram was performed to confirm the above findings. Lower extremity runoff views were then obtained by the pigtail catheter.  In the right lower extremity, the right common femoral profunda femoris and superficial femoral arteries are patent. There were several areas of 30% stenosis to 40% stenosis within the right superficial femoral artery. The distal popliteal artery is occluded. There is  reconstitution of the peroneal and posterior tibial arteries proximally via geniculate collaterals. These are in continuity from the mid leg down to the foot. The anterior tibial artery is occluded proximally. It reconstitutes via collaterals just above the ankle and does feel dorsalis pedis artery. There are similar findings in the left leg. Additionally, there is a high takeoff of the profundal on the left side. This comes off basically at the level of the femoral head.   At this point the pigtail catheter was removed over guidewire and a 5 [place] crossover catheter was brought up in the operative field and used to selectively catheterize the right common iliac artery. An 035 angled Glidewire was then advanced down to the distal right external iliac artery. 5 [place] crossover catheter was removed and replaced with a 5 French straight catheter. Right lower extremity arteriogram was obtained to obtain additional views of the distal right leg. This again showed occlusion of the below-knee popliteal artery. There is reconstitution of the peroneal and posterior tibial arteries in the proximal leg. The posterior tibial artery is the dominant runoff vessel to the right foot. The anterior tibial artery does reconstitute via collaterals several centimeters above the ankle and fills the dorsalis pedis artery.  At this point the 5 French straight catheter was removed over guidewire. An oblique view of the left common femoral artery was obtained which again confirmed a high takeoff of the profunda femoris artery. The needle insertion site however was just above the femoral bifurcation of the femoral head.  The 5 French sheath was left in place to be pulled in the holding area.  The patient tolerated the procedure well and there were no complications. The patient was taken to the holding area in stable condition.  Operative management: The patient will be scheduled for a right femoral to posterior tibial artery  bypass on Monday, April 20. I discussed with the patient today that he is still a very high risk of limb loss even with revascularization due to the extent of the wound on his right foot. Risks benefits possible complications and procedure details the posterior tibial artery bypass were discussed the patient today. He understands and agrees to proceed.  Fabienne Brunsharles Ruqayya Ventress, MD [facility] [place] Office: [phone] Pager: [phone]

## 2014-03-02 NOTE — Discharge Instructions (Signed)
Arteriogram °Care After °These instructions give you information on caring for yourself after your procedure. Your doctor may also give you more specific instructions. Call your doctor if you have any problems or questions after your procedure. °HOME CARE °· Stay in bed the rest of the day. °· Keep your leg straight for at least 6 hours. °· Do not lift anything heavier than 10 pounds (about a gallon of milk) for 2 days. °· Do not walk a lot, run, or drive for 2 days. °· Return to normal activities in 2 days or as told by your doctor. °Finding out the results of your test °Ask when your test results will be ready. Make sure you get your test results. °GET HELP RIGHT AWAY IF:  °· You have fever of 102° F (38.9° C) or higher. °· You have more pain in your leg. °· The leg that was cut is: °· Bleeding. °· Puffy (swollen) or red. °· Cold. °· Pale or changes color. °· Weak. °· Tingly or numb. °If you go to the Emergency Room, tell your nurse that you have had an arteriogram. Take this paper with you to show the nurse. °MAKE SURE YOU: °· Understand these instructions. °· Will watch your condition. °· Will get help right away if you are not doing well or get worse. °Document Released: 01/31/2009 Document Revised: 01/27/2012 Document Reviewed: 01/31/2009 °Exit[employer]® Patient Information ©2014 [employer]. ° °

## 2014-03-02 NOTE — Interval H&P Note (Signed)
History and Physical Interval Note:  03/02/2014 8:15 AM  Nicholas Morrison  has presented today for surgery, with the diagnosis of pad/non healing ulcer  The various methods of treatment have been discussed with the patient and family. After consideration of risks, benefits and other options for treatment, the patient has consented to  Procedure(s): ABDOMINAL AORTAGRAM (N/A)  With runoff and possible intervention.  The patient's history has been reviewed, patient examined, no change in status, stable for surgery.  I have reviewed the patient's chart and labs.  Questions were answered to the patient's satisfaction.     Sherren Kernsharles E Fields

## 2014-03-02 NOTE — Progress Notes (Signed)
VASCULAR LAB PRELIMINARY  PRELIMINARY  PRELIMINARY  PRELIMINARY  Right Lower Extremity Vein Map    Right Great Saphenous Vein   Segment Diameter Comment  1. Origin 6.2 mm   2. High Thigh 4.2 mm   3. Mid Thigh 2.4 mm Non compressible  4. Low Thigh mm    Unable to follow   5. At Knee 2.1 mm   6. High Calf 2.9 mm   7. Low Calf 2.5 mm   8. Ankle 2.mm Multiple branches with  wall thickening    Left Lower Extremity Vein Map    4.2Left Great Saphenous Vein   Segment Diameter Comment  1. Origin 4.2 mm   2. High Thigh 3.7 mm   3. Mid Thigh mm Not seen  4. Low Thigh mm Not seen  5. At Knee mm Not seen  6. High Calf mm Not seen  7. Low Calf mm Not seen   8. Ankle 1.8 mm      Technically difficult study Unable to visualize segments listed in chart above   [place] Nicholas Morrison, Nicholas Morrison 03/02/2014, 6:30 PM

## 2014-03-03 ENCOUNTER — Other Ambulatory Visit: Payer: Self-pay

## 2014-03-03 NOTE — ED Provider Notes (Signed)
Medical screening examination/treatment/procedure(s) were conducted as a shared visit with non-physician practitioner(s) and myself.  I personally evaluated the patient during the encounter.  Pt with rash after starting bactrim, suspect drug reaction, EM.  Pt to stop bactrim, has f/u tomorrow.   EKG Interpretation None       Olivia Mackielga M Amarie Viles, MD 03/03/14 [phone]

## 2014-03-04 ENCOUNTER — Encounter (HOSPITAL_COMMUNITY)
Admission: RE | Admit: 2014-03-04 | Discharge: 2014-03-04 | Disposition: A | Discharge status: Home / Self Care | Payer: Medicare Other | Source: Ambulatory Visit | Attending: Vascular Surgery | Admitting: Vascular Surgery

## 2014-03-04 ENCOUNTER — Encounter (HOSPITAL_COMMUNITY): Payer: Self-pay

## 2014-03-04 DIAGNOSIS — Z01812 Encounter for preprocedural laboratory examination: Secondary | ICD-10-CM | POA: Insufficient documentation

## 2014-03-04 HISTORY — DX: Constipation, unspecified: K59.00

## 2014-03-04 HISTORY — DX: Unspecified osteoarthritis, unspecified site: M19.90

## 2014-03-04 LAB — URINALYSIS, ROUTINE W REFLEX MICROSCOPIC
Bilirubin Urine: NEGATIVE
Glucose, UA: NEGATIVE mg/dL
KETONES UR: NEGATIVE mg/dL
Leukocytes, UA: NEGATIVE
Nitrite: NEGATIVE
PH: 6 (ref 5.0–8.0)
Protein, ur: NEGATIVE mg/dL
SPECIFIC GRAVITY, URINE: 1.01 (ref 1.005–1.030)
Urobilinogen, UA: 1 mg/dL (ref 0.0–1.0)

## 2014-03-04 LAB — COMPREHENSIVE METABOLIC PANEL
ALT: 39 U/L (ref 0–53)
AST: 37 U/L (ref 0–37)
Albumin: 3.1 g/dL — ABNORMAL LOW (ref 3.5–5.2)
Alkaline Phosphatase: 193 U/L — ABNORMAL HIGH (ref 39–117)
BILIRUBIN TOTAL: 0.3 mg/dL (ref 0.3–1.2)
BUN: 15 mg/dL (ref 6–23)
CALCIUM: 8.8 mg/dL (ref 8.4–10.5)
CO2: 26 mEq/L (ref 19–32)
CREATININE: 1.12 mg/dL (ref 0.50–1.35)
Chloride: 102 mEq/L (ref 96–112)
GFR calc Af Amer: 76 mL/min — ABNORMAL LOW (ref >90)
GFR calc non Af Amer: 66 mL/min — ABNORMAL LOW (ref >90)
GLUCOSE: 90 mg/dL (ref 70–99)
Potassium: 4.3 mEq/L (ref 3.7–5.3)
Sodium: 140 mEq/L (ref 137–147)
Total Protein: 6.7 g/dL (ref 6.0–8.3)

## 2014-03-04 LAB — TYPE AND SCREEN
ABO/RH(D): A POS
ANTIBODY SCREEN: NEGATIVE

## 2014-03-04 LAB — URINE MICROSCOPIC-ADD ON

## 2014-03-04 LAB — SURGICAL PCR SCREEN
MRSA, PCR: NEGATIVE
STAPHYLOCOCCUS AUREUS: NEGATIVE

## 2014-03-04 LAB — ABO/RH: ABO/RH(D): A POS

## 2014-03-04 NOTE — Pre-Procedure Instructions (Signed)
Nicholas Morrison  03/04/2014   Your procedure is scheduled on:  Monday, April 20.              [facility], Main Entrance/ Entrance "A" at 5:30 AM.  Call this number if you have problems the morning of surgery: [phone]   Remember:   Do not eat food or drink liquids after midnight Sunday.    Take these medicines the morning of surgery with A SIP OF WATER: amLODipine (NORVASC).             Take if needed:oxyCODONE-acetaminophen (PERCOCET/ROXICET).   Do not wear jewelry, make-up or nail polish.  Do not wear lotions, powders, or perfumes.  Do not shave 48 hours prior to surgery. Men may shave face and neck.  Do not bring valuables to the hospital.  [facility] is not responsible  for any belongings or valuables.               Contacts, dentures or bridgework may not be worn into surgery.  Leave suitcase in the car. After surgery it may be brought to your room.  For patients admitted to the hospital, discharge time is determined by your treatment team.                 Special Instructions: Review  [facility] - Preparing For Surgery.   Please read over the following fact sheets that you were given: Pain Magement, Coughing and Deep Breathing, Blood Transfusion, Surgical Infection.

## 2014-03-04 NOTE — Progress Notes (Signed)
03/04/14 1103  OBSTRUCTIVE SLEEP APNEA  Have you ever been diagnosed with sleep apnea through a sleep study? No  Do you snore loudly (loud enough to be heard through closed doors)?  1  Do you often feel tired, fatigued, or sleepy during the daytime? 0  Has anyone observed you stop breathing during your sleep? 1  Do you have, or are you being treated for high blood pressure? 1  BMI more than 35 kg/m2? 0  Age over 842 years old? 1  Neck circumference greater than 40 cm/18 inches? 0 (15)  Gender: 1  Obstructive Sleep Apnea Score 5  Score 4 or greater  Results sent to PCP

## 2014-03-04 NOTE — Progress Notes (Signed)
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  Patient wanted me to get results from Dr Gweneth FritterProctors office in [place] and not have a chest xray.  ( not sure patient had a chest xray there, states he had a MRI).  Dr Proctors office ? Closed on Fri- did not get an answer, left message for the office to call with fax number and I did not receive a call.  Will need chest x-ray DOS.

## 2014-03-06 MED ORDER — DEXTROSE 5 % IV SOLN
1.5000 g | INTRAVENOUS | Status: AC
  Administered 2014-03-07: 1.5 g via INTRAVENOUS
  Filled 2014-03-06: qty 1.5

## 2014-03-06 MED ORDER — SODIUM CHLORIDE 0.9 % IV SOLN
INTRAVENOUS | Status: DC
  Administered 2014-03-07: 50 mL/h via INTRAVENOUS

## 2014-03-07 ENCOUNTER — Encounter (HOSPITAL_COMMUNITY): Payer: Medicare Other | Admitting: Anesthesiology

## 2014-03-07 ENCOUNTER — Inpatient Hospital Stay (HOSPITAL_COMMUNITY): Payer: Medicare Other

## 2014-03-07 ENCOUNTER — Encounter (HOSPITAL_COMMUNITY): Payer: Self-pay

## 2014-03-07 ENCOUNTER — Encounter (HOSPITAL_COMMUNITY)
Admission: RE | Disposition: A | Discharge status: Home Health | Payer: Self-pay | Source: Ambulatory Visit | Attending: Vascular Surgery

## 2014-03-07 ENCOUNTER — Inpatient Hospital Stay (HOSPITAL_COMMUNITY)
Admission: RE | Admit: 2014-03-07 | Discharge: 2014-03-10 | DRG: 253 | Disposition: A | Discharge status: Home Health | Payer: Medicare Other | Source: Ambulatory Visit | Attending: Vascular Surgery | Admitting: Vascular Surgery

## 2014-03-07 ENCOUNTER — Inpatient Hospital Stay (HOSPITAL_COMMUNITY): Payer: Medicare Other | Admitting: Anesthesiology

## 2014-03-07 DIAGNOSIS — E785 Hyperlipidemia, unspecified: Secondary | ICD-10-CM | POA: Diagnosis present

## 2014-03-07 DIAGNOSIS — Z79899 Other long term (current) drug therapy: Secondary | ICD-10-CM

## 2014-03-07 DIAGNOSIS — I96 Gangrene, not elsewhere classified: Secondary | ICD-10-CM | POA: Diagnosis present

## 2014-03-07 DIAGNOSIS — K5900 Constipation, unspecified: Secondary | ICD-10-CM | POA: Diagnosis present

## 2014-03-07 DIAGNOSIS — I70269 Atherosclerosis of native arteries of extremities with gangrene, unspecified extremity: Secondary | ICD-10-CM

## 2014-03-07 DIAGNOSIS — Z7982 Long term (current) use of aspirin: Secondary | ICD-10-CM

## 2014-03-07 DIAGNOSIS — I10 Essential (primary) hypertension: Secondary | ICD-10-CM | POA: Diagnosis present

## 2014-03-07 DIAGNOSIS — I739 Peripheral vascular disease, unspecified: Secondary | ICD-10-CM

## 2014-03-07 DIAGNOSIS — F17200 Nicotine dependence, unspecified, uncomplicated: Secondary | ICD-10-CM | POA: Diagnosis present

## 2014-03-07 DIAGNOSIS — I252 Old myocardial infarction: Secondary | ICD-10-CM

## 2014-03-07 DIAGNOSIS — L02619 Cutaneous abscess of unspecified foot: Secondary | ICD-10-CM | POA: Diagnosis present

## 2014-03-07 DIAGNOSIS — L03119 Cellulitis of unspecified part of limb: Secondary | ICD-10-CM

## 2014-03-07 DIAGNOSIS — L97509 Non-pressure chronic ulcer of other part of unspecified foot with unspecified severity: Secondary | ICD-10-CM | POA: Diagnosis present

## 2014-03-07 DIAGNOSIS — Z01812 Encounter for preprocedural laboratory examination: Secondary | ICD-10-CM

## 2014-03-07 DIAGNOSIS — Z8249 Family history of ischemic heart disease and other diseases of the circulatory system: Secondary | ICD-10-CM

## 2014-03-07 HISTORY — PX: FEMORAL-TIBIAL BYPASS GRAFT: SHX938

## 2014-03-07 LAB — CBC
HCT: 39.6 % (ref 39.0–52.0)
Hemoglobin: 13.3 g/dL (ref 13.0–17.0)
MCH: 33.5 pg (ref 26.0–34.0)
MCHC: 33.6 g/dL (ref 30.0–36.0)
MCV: 99.7 fL (ref 78.0–100.0)
PLATELETS: 221 10*3/uL (ref 150–400)
RBC: 3.97 MIL/uL — AB (ref 4.22–5.81)
RDW: 12.4 % (ref 11.5–15.5)
WBC: 10 10*3/uL (ref 4.0–10.5)

## 2014-03-07 LAB — CREATININE, SERUM
CREATININE: 1 mg/dL (ref 0.50–1.35)
GFR calc Af Amer: 87 mL/min — ABNORMAL LOW (ref >90)
GFR, EST NON AFRICAN AMERICAN: 75 mL/min — AB (ref >90)

## 2014-03-07 LAB — APTT: APTT: 36 s (ref 24–37)

## 2014-03-07 SURGERY — CREATION, BYPASS, ARTERIAL, FEMORAL TO TIBIAL, USING GRAFT
Anesthesia: General | Site: Leg Lower | Laterality: Right

## 2014-03-07 MED ORDER — PROTAMINE SULFATE 10 MG/ML IV SOLN
INTRAVENOUS | Status: AC
  Filled 2014-03-07: qty 5

## 2014-03-07 MED ORDER — LIDOCAINE HCL 1 % IJ SOLN
INTRAMUSCULAR | Status: DC | PRN
  Administered 2014-03-07: 100 mg via INTRADERMAL

## 2014-03-07 MED ORDER — HYDROMORPHONE HCL PF 1 MG/ML IJ SOLN
INTRAMUSCULAR | Status: AC
  Filled 2014-03-07: qty 1

## 2014-03-07 MED ORDER — SUCCINYLCHOLINE CHLORIDE 20 MG/ML IJ SOLN
INTRAMUSCULAR | Status: AC
  Filled 2014-03-07: qty 1

## 2014-03-07 MED ORDER — LIDOCAINE HCL (CARDIAC) 20 MG/ML IV SOLN
INTRAVENOUS | Status: DC | PRN
  Administered 2014-03-07: 3 mL via INTRAVENOUS

## 2014-03-07 MED ORDER — ACETAMINOPHEN 160 MG/5ML PO SOLN: 325.0000 mg | ORAL | Status: DC | PRN

## 2014-03-07 MED ORDER — ENOXAPARIN SODIUM 30 MG/0.3ML ~~LOC~~ SOLN
30.0000 mg | SUBCUTANEOUS | Status: DC
  Administered 2014-03-08 – 2014-03-09 (×2): 30 mg via SUBCUTANEOUS
  Filled 2014-03-07 (×2): qty 0.3

## 2014-03-07 MED ORDER — THROMBIN 20000 UNITS EX SOLR
CUTANEOUS | Status: AC
  Filled 2014-03-07: qty 20000

## 2014-03-07 MED ORDER — ARTIFICIAL TEARS OP OINT
TOPICAL_OINTMENT | OPHTHALMIC | Status: AC
  Filled 2014-03-07: qty 3.5

## 2014-03-07 MED ORDER — CHLORHEXIDINE GLUCONATE 4 % EX LIQD
60.0000 mL | Freq: Once | CUTANEOUS | Status: DC
  Filled 2014-03-07: qty 60

## 2014-03-07 MED ORDER — ROCURONIUM BROMIDE 50 MG/5ML IV SOLN
INTRAVENOUS | Status: AC
  Filled 2014-03-07: qty 1

## 2014-03-07 MED ORDER — SODIUM CHLORIDE 0.9 % IR SOLN
Status: DC | PRN
  Administered 2014-03-07: 09:00:00

## 2014-03-07 MED ORDER — HEPARIN SODIUM (PORCINE) 1000 UNIT/ML IJ SOLN
INTRAMUSCULAR | Status: DC | PRN
  Administered 2014-03-07: 7000 [IU] via INTRAVENOUS

## 2014-03-07 MED ORDER — EPHEDRINE SULFATE 50 MG/ML IJ SOLN
INTRAMUSCULAR | Status: DC | PRN
  Administered 2014-03-07: 10 mg via INTRAVENOUS
  Administered 2014-03-07 (×3): 5 mg via INTRAVENOUS
  Administered 2014-03-07: 10 mg via INTRAVENOUS
  Administered 2014-03-07: 15 mg via INTRAVENOUS

## 2014-03-07 MED ORDER — DEXTROSE 5 % IV SOLN
1.5000 g | Freq: Two times a day (BID) | INTRAVENOUS | Status: AC
  Administered 2014-03-07 – 2014-03-08 (×2): 1.5 g via INTRAVENOUS
  Filled 2014-03-07 (×3): qty 1.5

## 2014-03-07 MED ORDER — SODIUM CHLORIDE 0.9 % IV SOLN: INTRAVENOUS | Status: DC

## 2014-03-07 MED ORDER — HYDROMORPHONE HCL PF 1 MG/ML IJ SOLN
0.2500 mg | INTRAMUSCULAR | Status: DC | PRN
  Administered 2014-03-07 (×2): 0.25 mg via INTRAVENOUS

## 2014-03-07 MED ORDER — IOHEXOL 300 MG/ML  SOLN
INTRAMUSCULAR | Status: DC | PRN
  Administered 2014-03-07: 50 mL via INTRA_ARTERIAL

## 2014-03-07 MED ORDER — ATORVASTATIN CALCIUM 40 MG PO TABS
40.0000 mg | ORAL_TABLET | Freq: Every day | ORAL | Status: DC
  Administered 2014-03-07 – 2014-03-09 (×2): 40 mg via ORAL
  Filled 2014-03-07 (×4): qty 1

## 2014-03-07 MED ORDER — NEOSTIGMINE METHYLSULFATE 1 MG/ML IJ SOLN
INTRAMUSCULAR | Status: DC | PRN
  Administered 2014-03-07: 3 mg via INTRAVENOUS

## 2014-03-07 MED ORDER — VECURONIUM BROMIDE 10 MG IV SOLR
INTRAVENOUS | Status: DC | PRN
  Administered 2014-03-07: 3 mg via INTRAVENOUS

## 2014-03-07 MED ORDER — AMLODIPINE BESYLATE 5 MG PO TABS
5.0000 mg | ORAL_TABLET | Freq: Every day | ORAL | Status: DC
  Administered 2014-03-07 – 2014-03-09 (×3): 5 mg via ORAL
  Filled 2014-03-07 (×4): qty 1

## 2014-03-07 MED ORDER — PHENOL 1.4 % MT LIQD: 1.0000 | OROMUCOSAL | Status: DC | PRN

## 2014-03-07 MED ORDER — FENTANYL CITRATE 0.05 MG/ML IJ SOLN
INTRAMUSCULAR | Status: AC
  Filled 2014-03-07: qty 5

## 2014-03-07 MED ORDER — ROCURONIUM BROMIDE 100 MG/10ML IV SOLN
INTRAVENOUS | Status: DC | PRN
  Administered 2014-03-07: 50 mg via INTRAVENOUS

## 2014-03-07 MED ORDER — MIDAZOLAM HCL 5 MG/5ML IJ SOLN
INTRAMUSCULAR | Status: DC | PRN
  Administered 2014-03-07: 2 mg via INTRAVENOUS

## 2014-03-07 MED ORDER — HYDRALAZINE HCL 20 MG/ML IJ SOLN: 10.0000 mg | INTRAMUSCULAR | Status: DC | PRN

## 2014-03-07 MED ORDER — ONDANSETRON HCL 4 MG/2ML IJ SOLN: 4.0000 mg | Freq: Once | INTRAMUSCULAR | Status: DC | PRN

## 2014-03-07 MED ORDER — ONDANSETRON HCL 4 MG/2ML IJ SOLN
INTRAMUSCULAR | Status: AC
  Filled 2014-03-07: qty 2

## 2014-03-07 MED ORDER — PROPOFOL 10 MG/ML IV BOLUS
INTRAVENOUS | Status: AC
  Filled 2014-03-07: qty 20

## 2014-03-07 MED ORDER — 0.9 % SODIUM CHLORIDE (POUR BTL) OPTIME
TOPICAL | Status: DC | PRN
  Administered 2014-03-07 (×2): 1000 mL

## 2014-03-07 MED ORDER — ACETAMINOPHEN 325 MG PO TABS: 325.0000 mg | ORAL_TABLET | ORAL | Status: DC | PRN

## 2014-03-07 MED ORDER — PANTOPRAZOLE SODIUM 40 MG PO TBEC
40.0000 mg | DELAYED_RELEASE_TABLET | Freq: Every day | ORAL | Status: DC
  Administered 2014-03-07 – 2014-03-10 (×4): 40 mg via ORAL
  Filled 2014-03-07 (×4): qty 1

## 2014-03-07 MED ORDER — HEPARIN SODIUM (PORCINE) 1000 UNIT/ML IJ SOLN
INTRAMUSCULAR | Status: AC
  Filled 2014-03-07: qty 1

## 2014-03-07 MED ORDER — LIDOCAINE HCL (CARDIAC) 20 MG/ML IV SOLN
INTRAVENOUS | Status: AC
  Filled 2014-03-07: qty 5

## 2014-03-07 MED ORDER — LACTATED RINGERS IV SOLN
INTRAVENOUS | Status: DC | PRN
  Administered 2014-03-07 (×3): via INTRAVENOUS

## 2014-03-07 MED ORDER — MIDAZOLAM HCL 2 MG/2ML IJ SOLN
INTRAMUSCULAR | Status: AC
  Filled 2014-03-07: qty 2

## 2014-03-07 MED ORDER — ACETAMINOPHEN 650 MG RE SUPP: 325.0000 mg | RECTAL | Status: DC | PRN

## 2014-03-07 MED ORDER — NEOSTIGMINE METHYLSULFATE 1 MG/ML IJ SOLN
INTRAMUSCULAR | Status: AC
  Filled 2014-03-07: qty 10

## 2014-03-07 MED ORDER — BISACODYL 5 MG PO TBEC: 5.0000 mg | DELAYED_RELEASE_TABLET | Freq: Every day | ORAL | Status: DC | PRN

## 2014-03-07 MED ORDER — METOPROLOL TARTRATE 1 MG/ML IV SOLN: 2.0000 mg | INTRAVENOUS | Status: DC | PRN

## 2014-03-07 MED ORDER — ASPIRIN 325 MG PO TABS
325.0000 mg | ORAL_TABLET | Freq: Every day | ORAL | Status: DC
  Administered 2014-03-07 – 2014-03-10 (×4): 325 mg via ORAL
  Filled 2014-03-07 (×4): qty 1

## 2014-03-07 MED ORDER — POTASSIUM CHLORIDE CRYS ER 20 MEQ PO TBCR: 20.0000 meq | EXTENDED_RELEASE_TABLET | Freq: Every day | ORAL | Status: DC | PRN

## 2014-03-07 MED ORDER — OXYCODONE-ACETAMINOPHEN 5-325 MG PO TABS
2.0000 | ORAL_TABLET | Freq: Every evening | ORAL | Status: DC
  Administered 2014-03-07 – 2014-03-08 (×2): 2 via ORAL
  Filled 2014-03-07 (×2): qty 2

## 2014-03-07 MED ORDER — ONDANSETRON HCL 4 MG/2ML IJ SOLN
INTRAMUSCULAR | Status: DC | PRN
  Administered 2014-03-07: 4 mg via INTRAVENOUS

## 2014-03-07 MED ORDER — DOCUSATE SODIUM 100 MG PO CAPS
100.0000 mg | ORAL_CAPSULE | Freq: Every day | ORAL | Status: DC
  Administered 2014-03-08 – 2014-03-09 (×2): 100 mg via ORAL
  Filled 2014-03-07 (×3): qty 1

## 2014-03-07 MED ORDER — FENTANYL CITRATE 0.05 MG/ML IJ SOLN
INTRAMUSCULAR | Status: DC | PRN
  Administered 2014-03-07 (×2): 100 ug via INTRAVENOUS
  Administered 2014-03-07: 150 ug via INTRAVENOUS
  Administered 2014-03-07: 50 ug via INTRAVENOUS

## 2014-03-07 MED ORDER — PROPOFOL 10 MG/ML IV BOLUS
INTRAVENOUS | Status: DC | PRN
  Administered 2014-03-07: 160 mg via INTRAVENOUS

## 2014-03-07 MED ORDER — SODIUM CHLORIDE 0.9 % IV SOLN: 500.0000 mL | Freq: Once | INTRAVENOUS | Status: AC | PRN

## 2014-03-07 MED ORDER — PHENYLEPHRINE HCL 10 MG/ML IJ SOLN
INTRAMUSCULAR | Status: DC | PRN
  Administered 2014-03-07: 40 ug via INTRAVENOUS
  Administered 2014-03-07: 80 ug via INTRAVENOUS
  Administered 2014-03-07: 40 ug via INTRAVENOUS

## 2014-03-07 MED ORDER — PAPAVERINE HCL 30 MG/ML IJ SOLN
INTRAMUSCULAR | Status: AC
Start: 2014-03-07 — End: 2014-03-07
  Filled 2014-03-07: qty 2

## 2014-03-07 MED ORDER — LABETALOL HCL 5 MG/ML IV SOLN
10.0000 mg | INTRAVENOUS | Status: DC | PRN
  Filled 2014-03-07: qty 4

## 2014-03-07 MED ORDER — SODIUM CHLORIDE 0.9 % IJ SOLN
INTRAMUSCULAR | Status: AC
  Filled 2014-03-07: qty 10

## 2014-03-07 MED ORDER — GLYCOPYRROLATE 0.2 MG/ML IJ SOLN
INTRAMUSCULAR | Status: DC | PRN
  Administered 2014-03-07: .4 mg via INTRAVENOUS

## 2014-03-07 MED ORDER — EPHEDRINE SULFATE 50 MG/ML IJ SOLN
INTRAMUSCULAR | Status: AC
  Filled 2014-03-07: qty 1

## 2014-03-07 MED ORDER — ONDANSETRON HCL 4 MG/2ML IJ SOLN: 4.0000 mg | Freq: Four times a day (QID) | INTRAMUSCULAR | Status: DC | PRN

## 2014-03-07 SURGICAL SUPPLY — 71 items
BANDAGE ESMARK 6X9 LF (GAUZE/BANDAGES/DRESSINGS) IMPLANT
BANDAGE GAUZE ELAST BULKY 4 IN (GAUZE/BANDAGES/DRESSINGS) ×2 IMPLANT
BNDG ESMARK 6X9 LF (GAUZE/BANDAGES/DRESSINGS)
CANISTER SUCTION 2500CC (MISCELLANEOUS) ×2 IMPLANT
CANNULA VESSEL W/WING WO/VALVE (CANNULA) ×2 IMPLANT
CLIP TI MEDIUM 24 (CLIP) ×2 IMPLANT
CLIP TI WIDE RED SMALL 24 (CLIP) ×4 IMPLANT
CONT SPEC 4OZ CLIKSEAL STRL BL (MISCELLANEOUS) ×2 IMPLANT
COVER SURGICAL LIGHT HANDLE (MISCELLANEOUS) ×2 IMPLANT
CUFF TOURNIQUET SINGLE 24IN (TOURNIQUET CUFF) IMPLANT
CUFF TOURNIQUET SINGLE 34IN LL (TOURNIQUET CUFF) IMPLANT
CUFF TOURNIQUET SINGLE 44IN (TOURNIQUET CUFF) IMPLANT
DERMABOND ADVANCED (GAUZE/BANDAGES/DRESSINGS) ×1
DERMABOND ADVANCED .7 DNX12 (GAUZE/BANDAGES/DRESSINGS) ×1 IMPLANT
DRAIN SNY WOU (WOUND CARE) IMPLANT
DRAPE WARM FLUID 44X44 (DRAPE) ×2 IMPLANT
DRAPE X-RAY CASS 24X20 (DRAPES) IMPLANT
DRSG COVADERM 4X10 (GAUZE/BANDAGES/DRESSINGS) ×2 IMPLANT
DRSG COVADERM 4X14 (GAUZE/BANDAGES/DRESSINGS) ×4 IMPLANT
DRSG COVADERM 4X8 (GAUZE/BANDAGES/DRESSINGS) ×2 IMPLANT
DRSG EMULSION OIL 3X3 NADH (GAUZE/BANDAGES/DRESSINGS) IMPLANT
ELECT REM PT RETURN 9FT ADLT (ELECTROSURGICAL) ×2
ELECTRODE REM PT RTRN 9FT ADLT (ELECTROSURGICAL) ×1 IMPLANT
EVACUATOR SILICONE 100CC (DRAIN) IMPLANT
GAUZE SPONGE 4X4 16PLY XRAY LF (GAUZE/BANDAGES/DRESSINGS) ×2 IMPLANT
GAUZE XEROFORM 5X9 LF (GAUZE/BANDAGES/DRESSINGS) ×2 IMPLANT
GLOVE BIO SURGEON STRL SZ 6.5 (GLOVE) ×4 IMPLANT
GLOVE BIO SURGEON STRL SZ7.5 (GLOVE) ×2 IMPLANT
GLOVE BIOGEL PI IND STRL 6.5 (GLOVE) ×3 IMPLANT
GLOVE BIOGEL PI IND STRL 7.0 (GLOVE) ×4 IMPLANT
GLOVE BIOGEL PI IND STRL 7.5 (GLOVE) ×2 IMPLANT
GLOVE BIOGEL PI INDICATOR 6.5 (GLOVE) ×3
GLOVE BIOGEL PI INDICATOR 7.0 (GLOVE) ×4
GLOVE BIOGEL PI INDICATOR 7.5 (GLOVE) ×2
GLOVE ECLIPSE 6.5 STRL STRAW (GLOVE) ×4 IMPLANT
GLOVE SURG SS PI 6.5 STRL IVOR (GLOVE) ×4 IMPLANT
GLOVE SURG SS PI 7.0 STRL IVOR (GLOVE) ×2 IMPLANT
GOWN STRL REUS W/ TWL LRG LVL3 (GOWN DISPOSABLE) ×5 IMPLANT
GOWN STRL REUS W/ TWL XL LVL3 (GOWN DISPOSABLE) ×2 IMPLANT
GOWN STRL REUS W/TWL LRG LVL3 (GOWN DISPOSABLE) ×5
GOWN STRL REUS W/TWL XL LVL3 (GOWN DISPOSABLE) ×2
KIT BASIN OR (CUSTOM PROCEDURE TRAY) ×2 IMPLANT
KIT ROOM TURNOVER OR (KITS) ×2 IMPLANT
NS IRRIG 1000ML POUR BTL (IV SOLUTION) ×4 IMPLANT
PACK PERIPHERAL VASCULAR (CUSTOM PROCEDURE TRAY) ×2 IMPLANT
PAD ARMBOARD 7.5X6 YLW CONV (MISCELLANEOUS) ×4 IMPLANT
PADDING CAST COTTON 6X4 STRL (CAST SUPPLIES) IMPLANT
SET COLLECT BLD 21X3/4 12 (NEEDLE) IMPLANT
SPONGE SURGIFOAM ABS GEL 100 (HEMOSTASIS) IMPLANT
STAPLER VISISTAT 35W (STAPLE) ×4 IMPLANT
STOPCOCK 4 WAY LG BORE MALE ST (IV SETS) IMPLANT
SUT PROLENE 5 0 C 1 24 (SUTURE) ×2 IMPLANT
SUT PROLENE 6 0 BV (SUTURE) ×2 IMPLANT
SUT PROLENE 6 0 CC (SUTURE) ×4 IMPLANT
SUT PROLENE 7 0 BV 1 (SUTURE) ×2 IMPLANT
SUT PROLENE 7 0 BV1 MDA (SUTURE) IMPLANT
SUT SILK 2 0 FS (SUTURE) ×2 IMPLANT
SUT SILK 2 0 SH (SUTURE) ×2 IMPLANT
SUT SILK 3 0 (SUTURE) ×4
SUT SILK 3-0 18XBRD TIE 12 (SUTURE) ×4 IMPLANT
SUT VIC AB 2-0 CTX 36 (SUTURE) ×4 IMPLANT
SUT VIC AB 3-0 SH 27 (SUTURE) ×4
SUT VIC AB 3-0 SH 27X BRD (SUTURE) ×4 IMPLANT
SUT VICRYL 4-0 PS2 18IN ABS (SUTURE) ×4 IMPLANT
TAPE UMBILICAL COTTON 1/8X30 (MISCELLANEOUS) ×2 IMPLANT
TOWEL OR 17X24 6PK STRL BLUE (TOWEL DISPOSABLE) ×4 IMPLANT
TOWEL OR 17X26 10 PK STRL BLUE (TOWEL DISPOSABLE) ×4 IMPLANT
TRAY FOLEY CATH 16FRSI W/METER (SET/KITS/TRAYS/PACK) ×2 IMPLANT
TUBING EXTENTION W/L.L. (IV SETS) IMPLANT
UNDERPAD 30X30 INCONTINENT (UNDERPADS AND DIAPERS) ×2 IMPLANT
WATER STERILE IRR 1000ML POUR (IV SOLUTION) ×2 IMPLANT

## 2014-03-07 NOTE — Op Note (Signed)
Procedure: Right femoral to posterior tibial bypass with  non-reversed ipsilateral great saphenous vein, arteriogram  Preoperative diagnosis: Gangrene right foot  Postoperative diagnosis: Same  Anesthesia: General  Asst.: Doreatha MassedSamantha Rhyne, PA-C  Operative findings:  3 mm diameter vein.  Distal anastomosis patent with diseased PT runoff  Operative details: After obtaining informed consent, the patient was taken to the operating room. The patient was placed in supine position on the operating room table. After induction of general anesthesia and endotracheal intubation, a Foley catheter was placed. Next, the patient's entire right lower extremity was prepped and draped in the usual sterile fashion. A longitudinal incision was then made in the right groin and carried down through the subcutaneous tissues to expose the right common femoral artery.  The common femoral artery was dissected free circumferentially. There was a pulse within the common femoral artery. The distal external iliac artery was dissected free circumferentially underneath the inguinal ligament.  A vessel loop was also placed around the distal external iliac artery. The profunda and SFA were also dissected free circumferentially and vessel loops placed around them.  Next the saphenofemoral junction was identified in the medial portion of the groin incision and this was harvested through several skip incisions on the medial aspect of the leg.  Side branches were ligated and divided between silk ties or clips.  The vein harvest incision was deepened into the fascia at the below knee segment and the below knee popliteal space was entered. This was to make room for tunneling between the heads of the gastrocnemius muscle. Next the posterior tibial artery was exposed through a medial incision after taking down the fascia and fibers of the soleus muscle. It was small and diseased. It was 2-2 mm in diameter. Several side branches were controlled  with silk ties.  I did not want to extend the incision further due to blister type wounds that were just below this incision.   A tunnel was then created between the heads of the gastrocnemius muscle subsartorial up to the groin.  The vein was ligated distally and at the saphenofemoral junction with a 2 0 silk tie.    The vein was gently distended with heparinized saline and inspected for hemostasis. It was placed in a nonreversed configuration. The end was spatulated.  The patient was given 7000 units of heparin.  After appropriate circulation time, the distal right external iliac artery was controlled with a vessel loop. The profunda and SFA were controlled with vessel loops.   A longitudinal opening was made in the common femoral artery on its anterior surface.   The vein was placed in a non reversed configuration.  The arteriotomy was extended with Pott's scissors.  The vein was spatulated and sewn end to side to the artery using a running 6 0 Prolene.  Just prior to completion anastomosis everything was forebled backbled and thoroughly flushed. Proximal clamp and distal clamps were removed and there was good pulsatile flow in the profunda femoris artery immediately.   The graft was then brought through the subsartorial tunnel down to the posterior tibial artery after marking for orientation. The posterior tibial artery was controlled proximally and distally with fine bulldog clamps. A longitudinal opening was made in the posterior tibial artery in an area that was fairly free of calcification. The leg was straightened. The graft was then cut to length and spatulated and sewn end of graft to side of artery using running 6-0 Prolene suture.  At completion of the anastomosis everything  was forebled backbled and thoroughly flushed. The remainder of the anastomosis was completed and all clamps were removed restoring pulsatile flow to the posterior tibial artery.  The patient had biphasic Doppler flow in the  posterior tibial of the foot.    Next completion arteriogram was obtained by introducing a butterfly needle into the proximal aspect of the vein graft. This was done with inflow occlusion. Contrast was injected the full length of the vein graft. This showed a patent distal anastomosis with some perianastomotic spasm but good runoff via posterior tibial artery.  The butterfly needle was removed and the hole repaired with a single 6-0 Prolene suture.  After hemostasis was obtained, the deep layers and subcutaneous layers of the below-knee popliteal incision were closed with running 3-0 Vicryl suture. The skin was closed with staples.   The saphenectomy incisions were closed with running 3 0 vicryl followed by staples.  The groin was inspected and found to be hemostatic. This was then closed in multiple layers of running 2 0 and 3-0 Vicryl suture and 4-0 subcuticular stitch. The patient tolerated the procedure well and there were no complications. Instrument sponge and needle counts were correct at the end of the case. Patient was taken to the recovery room in stable condition.  Fabienne Brunsharles Arya Boxley, MD [facility] [place] Office: [phone] Pager: [phone]

## 2014-03-07 NOTE — Anesthesia Postprocedure Evaluation (Signed)
  Anesthesia Post-op Note  Patient: Nicholas Morrison  Procedure(s) Performed: Procedure(s): BYPASS GRAFT RIGHT FEMORAL- POST TIBIAL ARTERY WITH INTRAOPERATIVE ARTERIOGRAM (Right)  Patient Location: PACU  Anesthesia Type:General  Level of Consciousness: awake and alert   Airway and Oxygen Therapy: Patient Spontanous Breathing and Patient connected to nasal cannula oxygen  Post-op Pain: mild  Post-op Assessment: Post-op Vital signs reviewed, Patient's Cardiovascular Status Stable, Respiratory Function Stable, Patent Airway, No signs of Nausea or vomiting and Pain level controlled  Post-op Vital Signs: Reviewed and stable  Last Vitals:  Filed Vitals:   03/07/14 1300  BP: 118/69  Pulse: 75  Temp:   Resp: 16    Complications: No apparent anesthesia complications

## 2014-03-07 NOTE — Interval H&P Note (Signed)
History and Physical Interval Note:  03/07/2014 7:29 AM  Nicholas Morrison  has presented today for surgery, with the diagnosis of Peripheral Arterial Disease Gangrene of right foot  The various methods of treatment have been discussed with the patient and family. After consideration of risks, benefits and other options for treatment, the patient has consented to  Procedure(s): BYPASS GRAFT RIGHT FEMORAL- POST TIBIAL ARTERY (Right) as a surgical intervention .  The patient's history has been reviewed, patient examined, no change in status, stable for surgery.  I have reviewed the patient's chart and labs.  Questions were answered to the patient's satisfaction.     Sherren Kernsharles E Teigen [place]low

## 2014-03-07 NOTE — Transfer of Care (Signed)
Immediate Anesthesia Transfer of Care Note  Patient: Nicholas Morrison  Procedure(s) Performed: Procedure(s): BYPASS GRAFT RIGHT FEMORAL- POST TIBIAL ARTERY WITH INTRAOPERATIVE ARTERIOGRAM (Right)  Patient Location: PACU  Anesthesia Type:General  Level of Consciousness: awake, alert  and oriented  Airway & Oxygen Therapy: Patient Spontanous Breathing and Patient connected to nasal cannula oxygen  Post-op Assessment: Report given to PACU RN and Post -op Vital signs reviewed and stable  Post vital signs: Reviewed and stable  Complications: No apparent anesthesia complications

## 2014-03-07 NOTE — Progress Notes (Signed)
Utilization review completed.  
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
## 2014-03-07 NOTE — Anesthesia Preprocedure Evaluation (Signed)
Anesthesia Evaluation  Patient identified by MRN, date of birth, ID band Patient awake    Reviewed: Allergy & Precautions, H&P , NPO status , Patient's Chart, lab work & pertinent test results  History of Anesthesia Complications Negative for: history of anesthetic complications  Airway Mallampati: II TM Distance: >3 FB Neck ROM: Full    Dental  (+) Teeth Intact   Pulmonary neg shortness of breath, neg sleep apnea, neg COPDformer smoker,  breath sounds clear to auscultation        Cardiovascular hypertension, Pt. on medications - angina+ Past MI and + Peripheral Vascular Disease - dysrhythmias - Valvular Problems/MurmursRhythm:Regular Rate:Normal     Neuro/Psych negative neurological ROS  negative psych ROS   GI/Hepatic negative GI ROS, Neg liver ROS,   Endo/Other  negative endocrine ROS  Renal/GU Renal InsufficiencyRenal disease     Musculoskeletal   Abdominal   Peds  Hematology   Anesthesia Other Findings   Reproductive/Obstetrics                           Anesthesia Physical Anesthesia Plan  ASA: III  Anesthesia Plan: General   Post-op Pain Management:    Induction: Intravenous  Airway Management Planned: Oral ETT  Additional Equipment: None  Intra-op Plan:   Post-operative Plan: Extubation in OR  Informed Consent: I have reviewed the patients History and Physical, chart, labs and discussed the procedure including the risks, benefits and alternatives for the proposed anesthesia with the patient or authorized representative who has indicated his/her understanding and acceptance.   Dental advisory given  Plan Discussed with: CRNA and Surgeon  Anesthesia Plan Comments:         Anesthesia Quick Evaluation

## 2014-03-08 DIAGNOSIS — Z48812 Encounter for surgical aftercare following surgery on the circulatory system: Secondary | ICD-10-CM

## 2014-03-08 LAB — CBC
HCT: 37.8 % — ABNORMAL LOW (ref 39.0–52.0)
Hemoglobin: 12.6 g/dL — ABNORMAL LOW (ref 13.0–17.0)
MCH: 33.4 pg (ref 26.0–34.0)
MCHC: 33.3 g/dL (ref 30.0–36.0)
MCV: 100.3 fL — ABNORMAL HIGH (ref 78.0–100.0)
PLATELETS: 231 10*3/uL (ref 150–400)
RBC: 3.77 MIL/uL — AB (ref 4.22–5.81)
RDW: 12.6 % (ref 11.5–15.5)
WBC: 10 10*3/uL (ref 4.0–10.5)

## 2014-03-08 LAB — BASIC METABOLIC PANEL
BUN: 11 mg/dL (ref 6–23)
CO2: 26 meq/L (ref 19–32)
Calcium: 8.1 mg/dL — ABNORMAL LOW (ref 8.4–10.5)
Chloride: 102 mEq/L (ref 96–112)
Creatinine, Ser: 1.09 mg/dL (ref 0.50–1.35)
GFR calc Af Amer: 79 mL/min — ABNORMAL LOW (ref >90)
GFR, EST NON AFRICAN AMERICAN: 68 mL/min — AB (ref >90)
Glucose, Bld: 90 mg/dL (ref 70–99)
Potassium: 4.6 mEq/L (ref 3.7–5.3)
SODIUM: 140 meq/L (ref 137–147)

## 2014-03-08 MED ORDER — HYDROMORPHONE HCL PF 1 MG/ML IJ SOLN
1.0000 mg | INTRAMUSCULAR | Status: DC | PRN
  Administered 2014-03-08 (×3): 1 mg via INTRAVENOUS
  Filled 2014-03-08 (×3): qty 1

## 2014-03-08 NOTE — Progress Notes (Signed)
VASCULAR LAB PRELIMINARY  ARTERIAL  ABI completed:    RIGHT    LEFT    PRESSURE WAVEFORM  PRESSURE WAVEFORM  BRACHIAL 135 Triphasic BRACHIAL 126 Triphasic  DP  Unable to insonate due to bandaging. DP 104 Monophasic  AT   AT    PT Unable to obtain due to bandaging. Monophasic PT 61 Severely dampened monophasic  PER   PER    GREAT TOE 113 NA GREAT TOE Did not obtain NA    RIGHT LEFT  ABI Unable to calculate 0.77   Unable to evaluate the right dorsalis pedis artery and obtain right posterior tibial artery pressure due to bandaging/ incision site. The right great toe pressure is suggestive of adequate perfusion for healing. The left ABI is suggestive of mild arterial insufficiency.  03/08/2014 4:11 PM Gertie FeyMichelle Mical Brun, RVT, RDCS, RDMS

## 2014-03-08 NOTE — Evaluation (Signed)
Physical Therapy Evaluation Patient Details Name: Nicholas Morrison MRN: [id number] DOB: [birth year] Today's Date: 03/08/2014   History of Present Illness  Pt s/p R fem to post tibial bypass  Clinical Impression  Pt tolerated OOB mobility well this date reporting atleast 50% WBing thru R LE. Anticipate pt to progress well enough to return home safely with assist of wife and use of AD. Pt does have 2 sets of 8 stairs he will need to manage prior to d/c.    Follow Up Recommendations No PT follow up;Supervision/Assistance - 24 hour    Equipment Recommendations  Rolling walker with 5" wheels (pt may be fine with crutches that he already has)    Recommendations for Other Services       Precautions / Restrictions Precautions Precautions: Fall Restrictions Weight Bearing Restrictions: Yes RLE Weight Bearing: Weight bearing as tolerated      Mobility  Bed Mobility Overal bed mobility: Needs Assistance Bed Mobility: Supine to Sit     Supine to sit: Supervision     General bed mobility comments: increased time, v/c's for technique, long sit technique  Transfers Overall transfer level: Needs assistance Equipment used: Rolling walker (2 wheeled) Transfers: Sit to/from Stand Sit to Stand: Min assist         General transfer comment: v/c's for hand placement, increased time  Ambulation/Gait Ambulation/Gait assistance: Min assist Ambulation Distance (Feet): 75 Feet Assistive device: Rolling walker (2 wheeled) Gait Pattern/deviations: Step-to pattern;Decreased stance time - right;Antalgic Gait velocity: slow   General Gait Details: pt reports putting atleast 50% WBing thru R LE,  Stairs            Wheelchair Mobility    Modified Rankin (Stroke Patients Only)       Balance Overall balance assessment: Needs assistance         Standing balance support: During functional activity Standing balance-Leahy Scale: Poor Standing balance comment: needs RW or crutches for  safe standing due to R LE pain                             Pertinent Vitals/Pain 5/10 R LE pain    Home Living Family/patient expects to be discharged to:: Private residence Living Arrangements: Spouse/significant other Available Help at Discharge: Family;Available 24 hours/day Type of Home: House Home Access: Stairs to enter Entrance Stairs-Rails: None Entrance Stairs-Number of Steps: 1 Home Layout: Multi-level Home Equipment: Crutches      Prior Function Level of Independence: Independent         Comments: was using crutches x 2 weeks prior to surgery     Hand Dominance   Dominant Hand: Right    Extremity/Trunk Assessment   Upper Extremity Assessment: Generalized weakness           Lower Extremity Assessment: RLE deficits/detail RLE Deficits / Details: pt able to raise R LE    Cervical / Trunk Assessment: Normal  Communication   Communication: No difficulties  Cognition Arousal/Alertness: Awake/alert Behavior During Therapy: WFL for tasks assessed/performed Overall Cognitive Status: Within Functional Limits for tasks assessed                      General Comments      Exercises General Exercises - Lower Extremity Ankle Circles/Pumps: AROM;Right;10 reps;Seated      Assessment/Plan    PT Assessment Patient needs continued PT services  PT Diagnosis Difficulty walking;Acute pain   PT Problem List Decreased strength;Decreased  mobility;Decreased range of motion;Decreased activity tolerance;Decreased balance  PT Treatment Interventions DME instruction;Gait training;Stair training;Functional mobility training;Therapeutic activities;Therapeutic exercise   PT Goals (Current goals can be found in the Care Plan section) Acute Rehab PT Goals Patient Stated Goal: to get better PT Goal Formulation: With patient Time For Goal Achievement: 03/15/14 Potential to Achieve Goals: Good    Frequency Min 3X/week   Barriers to discharge         Co-evaluation               End of Session Equipment Utilized During Treatment: Gait belt Activity Tolerance: Patient tolerated treatment well Patient left: in chair;with call bell/phone within reach (LEs elevated) Nurse Communication: Mobility status (walk atleast 1 more time today)         Time: 1610-9604: 0738-0817 PT Time Calculation (min): 39 min   Charges:   PT Evaluation $Initial PT Evaluation Tier I: 1 Procedure PT Treatments $Gait Training: 8-22 mins $Therapeutic Exercise: 8-22 mins   PT G CodesIona Morrison:          Nicholas Morrison M Nicholas Morrison 03/08/2014, 8:36 AM  Nicholas Morrison, PT, DPT Pager #: [phone] Office #: [phone]

## 2014-03-08 NOTE — Progress Notes (Addendum)
[facility] Progress Note  03/08/2014 7:25 AM 1 Day Post-Op  Subjective:  No complaints.  States he can move his toes a little more today  Afebrile VSS 98% RA  Filed Vitals:   03/08/14 0540  BP: 119/63  Pulse: 74  Temp: 98.3 F (36.8 C)  Resp: 16    Physical Exam: Incisions:  Right groin incision is c/d/i-the rest of the incisions are covered and dry. Extremities:  Right foot is wrapped; there is a strong right DP doppler signal;  Left  Monophasic left peroneal signal  CBC    Component Value Date/Time   WBC 10.0 03/08/2014 0110   RBC 3.77* 03/08/2014 0110   HGB 12.6* 03/08/2014 0110   HCT 37.8* 03/08/2014 0110   PLT 231 03/08/2014 0110   MCV 100.3* 03/08/2014 0110   MCH 33.4 03/08/2014 0110   MCHC 33.3 03/08/2014 0110   RDW 12.6 03/08/2014 0110   LYMPHSABS 2.0 02/28/2014 0142   MONOABS 0.9 02/28/2014 0142   EOSABS 0.2 02/28/2014 0142   BASOSABS 0.0 02/28/2014 0142    BMET    Component Value Date/Time   NA 140 03/08/2014 0110   K 4.6 03/08/2014 0110   CL 102 03/08/2014 0110   CO2 26 03/08/2014 0110   GLUCOSE 90 03/08/2014 0110   BUN 11 03/08/2014 0110   CREATININE 1.09 03/08/2014 0110   CALCIUM 8.1* 03/08/2014 0110   GFRNONAA 68* 03/08/2014 0110   GFRAA 79* 03/08/2014 0110    INR    Component Value Date/Time   INR 1.04 02/28/2014 0142     Intake/Output Summary (Last 24 hours) at 03/08/14 0725 Last data filed at 03/08/14 0544  Gross per 24 hour  Intake 2176.67 ml  Output   1525 ml  Net 651.67 ml     Assessment:  68 y.o. male is s/p:  Right femoral to posterior tibial bypass with non-reversed ipsilateral great saphenous vein, arteriogram  1 Day Post-Op  Plan: -pt doing well this morning.  He has a strong right PT signal -needs to get up out of bed; increase ambulation -BUN/Cr normal this am after intraoperative arteriogram -will transfer to telemetry floor -DVT prophylaxis:  Lovenox to start today   Doreatha MassedSamantha Rhyne, PA-C [facility] [phone] 03/08/2014 7:25 AM   Biphasic PT doppler,some foot edema   Agree with above.  Extensive right foot wounds hopefully will heal overtime.  Xeroform dressing once daily.  Transfer 2W.  Ambulate.  Fabienne Brunsharles Fields, MD [facility] [place] Office: [phone] Pager: [phone]

## 2014-03-08 NOTE — Progress Notes (Signed)
Transfer to 2W39. Report called to HutchisonAdrienne, Charity fundraiserN.

## 2014-03-08 NOTE — Progress Notes (Signed)
OT Cancellation Note  Patient Details Name: Nicholas Morrison MRN: [id number] DOB: 1946/06/14   CancelPrudencio Burlyled Treatment:    Reason Eval/Treat Not Completed: Other (comment) (Pt being transferred to florr. Will try again later.)  [facility] [place] LPilary S Shalanda Brogden Daegon Deiss, OTR/L  [phone] 03/08/2014 03/08/2014, 3:26 PM

## 2014-03-09 ENCOUNTER — Telehealth: Payer: Self-pay | Admitting: Vascular Surgery

## 2014-03-09 MED ORDER — ENOXAPARIN SODIUM 40 MG/0.4ML ~~LOC~~ SOLN
40.0000 mg | SUBCUTANEOUS | Status: DC
  Filled 2014-03-09: qty 0.4

## 2014-03-09 MED ORDER — OXYCODONE HCL 5 MG PO TABS: 5.0000 mg | ORAL_TABLET | ORAL | Status: DC | PRN

## 2014-03-09 MED ORDER — OXYCODONE-ACETAMINOPHEN 5-325 MG PO TABS
1.0000 | ORAL_TABLET | Freq: Four times a day (QID) | ORAL | Status: DC | PRN
  Administered 2014-03-09: 2 via ORAL
  Administered 2014-03-10: 1 via ORAL
  Filled 2014-03-09: qty 1
  Filled 2014-03-09: qty 2

## 2014-03-09 MED ORDER — OXYCODONE-ACETAMINOPHEN 5-325 MG PO TABS: 1.0000 | ORAL_TABLET | Freq: Four times a day (QID) | ORAL | Status: AC | PRN

## 2014-03-09 NOTE — Discharge Summary (Signed)
[facility] Discharge Summary  Nicholas Morrison 1946-10-12 68 y.o. male  [id number]  Admission Date: 03/07/2014  Discharge Date: 03/10/14  Physician: Sherren Kerns, MD  Admission Diagnosis: Peripheral Arterial Disease Gangrene of right foot   HPI:   This is a 68 y.o. male patient of Dr. Darrick Penna who is s/p Aortogram with bilateral lower extremity runoff on 10/19/2012; findings were as follows:  No significant right SFA occlusive disease; 50% stenosis left SFA, two vessel run off to the right and left foot via a diseased peroneal and posterior tibial artery.  Patient returns today, referred by Dr. Emilio Math, for complaint of right dorsum and plantar foot ulcer, painful- seen in ED, [place], 3 days ago,pt on antibiotics. He has a PICC line and is receiving po and IV antibx, and silvadene dressing change to right foot wound twice daily; wife states some improvement noted.  Wife states that pt had a corn removed plantar aspect base of right great toe, seven weeks later pt developed a wound at base of right great toe.  Tingling and numbness most of the time in both feet, right worse than left.  He rarely has pain/burning in the toes and heels of both feet, this used occur more often, is now rare.  He has mild claudication symptoms in both calves after walking 1000 feet, relieved in a few minutes by rest.  He does not have DM.  He denies lumbar spine problems.  He denies any other non healing wounds.  He has had more than one MI, none since 1992, has not seen a cardiologist since then.  Pt has not had previous peripheral vascular intervention.  Hospital Course:  The patient was admitted to the hospital and taken to the operating room on 03/07/2014 and underwent: Right femoral to posterior tibial bypass with non-reversed ipsilateral great saphenous vein, arteriogram    The pt tolerated the procedure well and was transported to the PACU in good condition. He had a  biphasic PT doppler signal and some foot edema.  He was transferred to the telemetry floor that day.  Post operative ABI's as follows:  RIGHT    LEFT     PRESSURE  WAVEFORM   PRESSURE  WAVEFORM   BRACHIAL  135  Triphasic  BRACHIAL  126  Triphasic   DP   Unable to insonate due to bandaging.  DP  104  Monophasic   AT    AT     PT  Unable to obtain due to bandaging.  Monophasic  PT  61  Severely dampened monophasic   PER    PER     GREAT TOE  113  NA  GREAT TOE  Did not obtain  NA     RIGHT  LEFT   ABI  Unable to calculate  0.77    By POD 2, he has a palpable PT pulse.  Blisters to his feet are slowly drying out.  His incisions are intact.  He is discharged on POD 3.  The remainder of the hospital course consisted of increasing mobilization and increasing intake of solids without difficulty.  CBC    Component Value Date/Time   WBC 10.0 03/08/2014 0110   RBC 3.77* 03/08/2014 0110   HGB 12.6* 03/08/2014 0110   HCT 37.8* 03/08/2014 0110   PLT 231 03/08/2014 0110   MCV 100.3* 03/08/2014 0110   MCH 33.4 03/08/2014 0110   MCHC 33.3 03/08/2014 0110   RDW 12.6 03/08/2014 0110   LYMPHSABS  2.0 02/28/2014 0142   MONOABS 0.9 02/28/2014 0142   EOSABS 0.2 02/28/2014 0142   BASOSABS 0.0 02/28/2014 0142    BMET    Component Value Date/Time   NA 140 03/08/2014 0110   K 4.6 03/08/2014 0110   CL 102 03/08/2014 0110   CO2 26 03/08/2014 0110   GLUCOSE 90 03/08/2014 0110   BUN 11 03/08/2014 0110   CREATININE 1.09 03/08/2014 0110   CALCIUM 8.1* 03/08/2014 0110   GFRNONAA 68* 03/08/2014 0110   GFRAA 79* 03/08/2014 0110     Discharge Instructions:   The patient is discharged to home with extensive instructions on wound care and progressive ambulation.  They are instructed not to drive or perform any heavy lifting until returning to see the physician in his office.  Discharge Orders   Future Appointments Provider Department Dept Phone   03/24/2014 10:00 AM Sherren Kernsharles E Fields, MD [facility] [phone]   Future Orders Complete By Expires   Call MD for:  redness, tenderness, or signs of infection (pain, swelling, bleeding, redness, odor or green/yellow discharge around incision site)  As directed    Call MD for:  severe or increased pain, loss or decreased feeling  in affected limb(s)  As directed    Call MD for:  temperature >100.5  As directed    Discharge wound care:  As directed    Driving Restrictions  As directed    Lifting restrictions  As directed    Resume previous diet  As directed       Discharge Diagnosis:  Peripheral Arterial Disease Gangrene of right foot  Secondary Diagnosis: Patient Active Problem List   Diagnosis Date Noted  . Gangrene 03/07/2014  . Atherosclerosis of native arteries of the extremities with ulceration(440.23) 02/24/2014  . Essential hypertension 01/08/2013  . Smoker 01/08/2013  . Atherosclerosis of native arteries of the extremities with intermittent claudication 10/22/2012  . Peripheral vascular disease, unspecified 10/22/2012   Past Medical History  Diagnosis Date  . Peripheral vascular disease   . Hyperlipidemia   . Hypertension   . Claudication   . Myocardial infarction 1990 ish  . Arthritis   . Constipation        Medication List    STOP taking these medications       SSD 1 % cream  Generic drug:  silver sulfADIAZINE      TAKE these medications       amLODipine 5 MG tablet  Commonly known as:  NORVASC  Take 5 mg by mouth at bedtime.     aspirin 325 MG tablet  Take 325 mg by mouth daily.     atorvastatin 40 MG tablet  Commonly known as:  LIPITOR  Take 40 mg by mouth daily.     bisacodyl 5 MG EC tablet  Commonly known as:  DULCOLAX  Take 5 mg by mouth daily as needed for moderate constipation.     COQ-10 PO  Take 1 tablet by mouth daily.     oxyCODONE-acetaminophen 5-325 MG per tablet  Commonly known as:  PERCOCET/ROXICET  Take 1-2 tablets by mouth every 6 (six) hours as needed for  severe pain.     sodium chloride irrigation 0.9 % irrigation  Irrigate with as directed 2 (two) times daily.        Percocet #30 No Refill  Disposition: home  Patient's condition: is Good  Follow up: 1. Dr. Darrick PennaFields in 2 weeks   Doreatha MassedSamantha Melodee Lupe, PA-C [facility] [phone] 03/09/2014  2:19 PM  - For VQI Registry use --- Instructions: Press F2 to tab through selections.  Delete question if not applicable.   Post-op:  Wound infection: No  Graft infection: No  Transfusion: No  If yes, n/a units given New Arrhythmia: No Ipsilateral amputation: No, [ ]  Minor, [ ]  BKA, [ ]  AKA Discharge patency: [x ] Primary, [ ]  Primary assisted, [ ]  Secondary, [ ]  Occluded Patency judged by: [ ]  Dopper only, [ ]  Palpable graft pulse, [ x] Palpable distal pulse, [ ]  ABI inc. > 0.15, [ ]  Duplex Discharge ABI: R unable to calculate due to bandagees, L 0.77 Discharge TBI: R , L  D/C Ambulatory Status: Ambulatory  Complications: MI: No, [ ]  Troponin only, [ ]  EKG or Clinical CHF: No Resp failure:No, [ ]  Pneumonia, [ ]  Ventilator Chg in renal function: No, [ ]  Inc. Cr > 0.5, [ ]  Temp. Dialysis, [ ]  Permanent dialysis Stroke: No, [ ]  Minor, [ ]  Major Return to OR: No  Reason for return to OR: [ ]  Bleeding, [ ]  Infection, [ ]  Thrombosis, [ ]  Revision  Discharge medications: Statin use:  yes ASA use:  yes Plavix use:  no Beta blocker use: no Coumadin use: no

## 2014-03-09 NOTE — Telephone Encounter (Addendum)
Message copied by Fredrich BirksMILLIKAN, DANA P on Wed Mar 09, 2014 10:48 AM ------      Message from: Phillips OdorPULLINS, CAROL S      Created: Tue Mar 08, 2014  9:44 AM      Regarding: Tiburcio BashKay log; needs f/u appt.                    ----- Message -----         From: Dara LordsSamantha J Rhyne, PA-C         Sent: 03/08/2014   7:39 AM           To: Vvs Charge Pool            S/p left fem-tib bypass 03/07/14.  F/u with Dr. Darrick PennaFields in 2 weeks.            Thanks,      Samantha ------  03/09/14: spoke with pts wife, dpm

## 2014-03-09 NOTE — Progress Notes (Signed)
Pt dressing changed by PA, per patient. Monitoring will continue.

## 2014-03-09 NOTE — Evaluation (Signed)
Occupational Therapy Evaluation Patient Details Name: Prudencio Burlyony Ohlsen MRN: [id number] DOB: [birth year] Today's Date: 03/09/2014    History of Present Illness Pt s/p R fem to post tibial bypass   Clinical Impression   Pt ready to become more mobile.  Used crutches to move about the room with supervision. Pt with difficulty reaching R foot, but will rely on wife. Does not plan to shower with wounds.  No further OT needs.   Follow Up Recommendations  No OT follow up    Equipment Recommendations  None recommended by OT    Recommendations for Other Services       Precautions / Restrictions Precautions Precautions: Fall Restrictions Weight Bearing Restrictions: Yes RLE Weight Bearing: Weight bearing as tolerated      Mobility Bed Mobility Overal bed mobility: Independent Bed Mobility: Supine to Sit     Supine to sit: Independent     General bed mobility comments: no difficulty  Transfers   Equipment used: Crutches Transfers: Sit to/from Stand Sit to Stand: Modified independent (Device/Increase time)         General transfer comment: stood and donned front opening gown without difficulty    Balance             Standing balance-Leahy Scale: Good Standing balance comment: can tolerate some weight on R LE for balance leaving UEs free                            ADL Overall ADL's : Needs assistance/impaired Eating/Feeding: Independent   Grooming: Wash/dry hands;Supervision/safety;Standing   Upper Body Bathing: Modified independent;Sitting   Lower Body Bathing: Minimal assistance;Sit to/from stand   Upper Body Dressing : Modified independent;Standing   Lower Body Dressing: Minimal assistance;Sit to/from stand   Toilet Transfer: Supervision/safety;Regular Social workerToilet   Toileting- Clothing Manipulation and Hygiene: Independent;Sitting/lateral lean       Functional mobility during ADLs: Supervision/safety General ADL Comments: Pt with difficulty  reaching R foot, will rely on wife.  He is aware of AE for LB bathing and dressing.     Vision                     Perception     Praxis      Pertinent Vitals/Pain R LE did not rate, premedicated     Hand Dominance Right   Extremity/Trunk Assessment Upper Extremity Assessment Upper Extremity Assessment: Overall WFL for tasks assessed   Lower Extremity Assessment Lower Extremity Assessment: Defer to PT evaluation   Cervical / Trunk Assessment Cervical / Trunk Assessment: Normal   Communication Communication Communication: No difficulties   Cognition Arousal/Alertness: Awake/alert Behavior During Therapy: WFL for tasks assessed/performed Overall Cognitive Status: Within Functional Limits for tasks assessed                     General Comments       Exercises       Shoulder Instructions      Home Living Family/patient expects to be discharged to:: Private residence Living Arrangements: Spouse/significant other Available Help at Discharge: Family;Available 24 hours/day Type of Home: House Home Access: Stairs to enter Entergy CorporationEntrance Stairs-Number of Steps: 1 Entrance Stairs-Rails: None Home Layout: Multi-level Alternate Level Stairs-Number of Steps: 8 Alternate Level Stairs-Rails: Left Bathroom Shower/Tub: Other (comment) (can't shower with wounds)   Bathroom Toilet: Standard     Home Equipment: Crutches          Prior Functioning/Environment Level of  Independence: Independent        Comments: was using crutches x 2 weeks prior to surgery, works as a Manufacturing systems engineerwelder    OT Diagnosis:     OT Problem List:     OT Treatment/Interventions:      OT Goals(Current goals can be found in the care plan section) Acute Rehab OT Goals Patient Stated Goal: to get better  OT Frequency:     Barriers to D/C:            Co-evaluation              End of Session    Activity Tolerance: Patient tolerated treatment well Patient left: in chair;with  call bell/phone within reach   Time: 0945-1010 OT Time Calculation (min): 25 min Charges:  OT General Charges $OT Visit: 1 Procedure OT Evaluation $Initial OT Evaluation Tier I: 1 Procedure OT Treatments $Self Care/Home Management : 8-22 mins G-Codes:    Dayton BailiffJulie Lynn Lary Eckardt 03/09/2014, 10:24 AM

## 2014-03-09 NOTE — Progress Notes (Addendum)
[facility] Progress Note  03/09/2014 7:59 AM 2 Days Post-Op  Subjective:  No complaints  Tm 99 now afebrile VSS 96% RA  Filed Vitals:   03/09/14 0520  BP: 139/65  Pulse: 81  Temp: 98.4 F (36.9 C)  Resp: 18    Physical Exam: Incisions:  All are healing nicely with staples; right groin also healing nicely Extremities:  Right foot is wrapped; strong doppler signal in right PT  CBC    Component Value Date/Time   WBC 10.0 03/08/2014 0110   RBC 3.77* 03/08/2014 0110   HGB 12.6* 03/08/2014 0110   HCT 37.8* 03/08/2014 0110   PLT 231 03/08/2014 0110   MCV 100.3* 03/08/2014 0110   MCH 33.4 03/08/2014 0110   MCHC 33.3 03/08/2014 0110   RDW 12.6 03/08/2014 0110   LYMPHSABS 2.0 02/28/2014 0142   MONOABS 0.9 02/28/2014 0142   EOSABS 0.2 02/28/2014 0142   BASOSABS 0.0 02/28/2014 0142    BMET    Component Value Date/Time   NA 140 03/08/2014 0110   K 4.6 03/08/2014 0110   CL 102 03/08/2014 0110   CO2 26 03/08/2014 0110   GLUCOSE 90 03/08/2014 0110   BUN 11 03/08/2014 0110   CREATININE 1.09 03/08/2014 0110   CALCIUM 8.1* 03/08/2014 0110   GFRNONAA 68* 03/08/2014 0110   GFRAA 79* 03/08/2014 0110    INR    Component Value Date/Time   INR 1.04 02/28/2014 0142     Intake/Output Summary (Last 24 hours) at 03/09/14 0759 Last data filed at 03/09/14 0400  Gross per 24 hour  Intake   1250 ml  Output   2450 ml  Net  -1200 ml   ABI's 03/08/14:  RIGHT    LEFT     PRESSURE  WAVEFORM   PRESSURE  WAVEFORM   BRACHIAL  135  Triphasic  BRACHIAL  126  Triphasic   DP   Unable to insonate due to bandaging.  DP  104  Monophasic   AT    AT     PT  Unable to obtain due to bandaging.  Monophasic  PT  61  Severely dampened monophasic   PER    PER     GREAT TOE  113  NA  GREAT TOE  Did not obtain  NA     RIGHT  LEFT   ABI  Unable to calculate  0.77   Unable to evaluate the right dorsalis pedis artery and obtain right posterior tibial artery pressure due to bandaging/ incision site.  The right great toe pressure is suggestive of adequate perfusion for healing. The left ABI is suggestive of mild arterial insufficiency.   Assessment:  68 y.o. male is s/p:  Right femoral to posterior tibial bypass with non-reversed ipsilateral great saphenous vein, arteriogram   2 Days Post-Op  Plan: -pt doing well this morning -continue to ambulate and mobilize -probably home in the next 1-2 days -DVT prophylaxis:  Lovenox   Doreatha MassedSamantha Rhyne, PA-C [facility] [phone] 03/09/2014 7:59 AM    2+ PT pulse today Blisters slowly drying out Incisions intact D/c home tomorrow  Fabienne Brunsharles Fields, MD [facility] [place] Office: [phone] Pager: [phone]

## 2014-03-09 NOTE — Progress Notes (Signed)
Physical Therapy Treatment Patient Details Name: Nicholas Morrison MRN: [id number] DOB: 22-Apr-1946 Today's Date: 03/09/2014    History of Present Illness Pt s/p R fem to post tibial bypass    PT Comments    Pt did well with crutches and able to increase R LE WB.  Initiated stair training.  Recommend another session on stairs before d/c.  Follow Up Recommendations  No PT follow up;Supervision/Assistance - 24 hour     Equipment Recommendations  None recommended by PT    Recommendations for Other Services       Precautions / Restrictions Precautions Precautions: Fall Restrictions Weight Bearing Restrictions: Yes RLE Weight Bearing: Weight bearing as tolerated    Mobility  Bed Mobility Overal bed mobility: Independent Bed Mobility: Supine to Sit     Supine to sit: Independent     General bed mobility comments: no difficulty  Transfers Overall transfer level: Needs assistance Equipment used: Crutches Transfers: Sit to/from Stand Sit to Stand: Supervision;Modified independent (Device/Increase time)         General transfer comment: stood and donned front opening gown without difficulty  Ambulation/Gait Ambulation/Gait assistance: Supervision Ambulation Distance (Feet): 175 Feet Assistive device: Crutches Gait Pattern/deviations: Trunk flexed;Step-to pattern Gait velocity: slow   General Gait Details: Increased WB through R LE with Darco shoe on that he brought in from home.     Stairs Stairs: Yes Stairs assistance: Min guard Stair Management: One rail Right;One rail Left;Sideways;Forwards;With crutches Number of Stairs: 6 General stair comments: Pt went up sideways with R rail then L rail.  He also tried with 1 rail crutch and 1 rail forwards.    Wheelchair Mobility    Modified Rankin (Stroke Patients Only)       Balance             Standing balance-Leahy Scale: Good Standing balance comment: can tolerate some weight on R LE for balance leaving  UEs free                    Cognition Arousal/Alertness: Awake/alert Behavior During Therapy: WFL for tasks assessed/performed Overall Cognitive Status: Within Functional Limits for tasks assessed                      Exercises      General Comments        Pertinent Vitals/Pain 94% o2 on RA and HR 73    Home Living Family/patient expects to be discharged to:: Private residence Living Arrangements: Spouse/significant other Available Help at Discharge: Family;Available 24 hours/day Type of Home: House Home Access: Stairs to enter Entrance Stairs-Rails: None Home Layout: Multi-level Home Equipment: Crutches      Prior Function Level of Independence: Independent      Comments: was using crutches x 2 weeks prior to surgery, works as a Dentistwelder   PT Goals (current goals can now be found in the care plan section) Acute Rehab PT Goals Patient Stated Goal: to get better Progress towards PT goals: Progressing toward goals    Frequency  Min 3X/week    PT Plan Current plan remains appropriate    Co-evaluation             End of Session Equipment Utilized During Treatment: Gait belt Activity Tolerance: Patient tolerated treatment well Patient left: in chair;with call bell/phone within reach     Time: 1015-1039 PT Time Calculation (min): 24 min  Charges:  $Gait Training: 23-37 mins  G Codes:      Nicholas MontgomeryKaren L Janan Bogie 03/09/2014, 11:11 AM

## 2014-03-10 ENCOUNTER — Encounter (HOSPITAL_COMMUNITY): Payer: Self-pay | Admitting: Vascular Surgery

## 2014-03-10 DIAGNOSIS — Z0279 Encounter for issue of other medical certificate: Secondary | ICD-10-CM

## 2014-03-10 NOTE — Progress Notes (Signed)
[facility] Progress Note  03/10/2014 7:57 AM 3 Days Post-Op  Subjective:  Less pain  Tm 99.9 now afebrile VSS 97% RA  Filed Vitals:   03/10/14 0510  BP: 121/67  Pulse: 71  Temp: 98 F (36.7 C)  Resp: 18    Physical Exam: Incisions:  Right leg continues to heal Extremities:  2+ PT pulse foot warm blisters slowly drying out  CBC    Component Value Date/Time   WBC 10.0 03/08/2014 0110   RBC 3.77* 03/08/2014 0110   HGB 12.6* 03/08/2014 0110   HCT 37.8* 03/08/2014 0110   PLT 231 03/08/2014 0110   MCV 100.3* 03/08/2014 0110   MCH 33.4 03/08/2014 0110   MCHC 33.3 03/08/2014 0110   RDW 12.6 03/08/2014 0110   LYMPHSABS 2.0 02/28/2014 0142   MONOABS 0.9 02/28/2014 0142   EOSABS 0.2 02/28/2014 0142   BASOSABS 0.0 02/28/2014 0142    BMET    Component Value Date/Time   NA 140 03/08/2014 0110   K 4.6 03/08/2014 0110   CL 102 03/08/2014 0110   CO2 26 03/08/2014 0110   GLUCOSE 90 03/08/2014 0110   BUN 11 03/08/2014 0110   CREATININE 1.09 03/08/2014 0110   CALCIUM 8.1* 03/08/2014 0110   GFRNONAA 68* 03/08/2014 0110   GFRAA 79* 03/08/2014 0110    INR    Component Value Date/Time   INR 1.04 02/28/2014 0142     Intake/Output Summary (Last 24 hours) at 03/10/14 0757 Last data filed at 03/10/14 0600  Gross per 24 hour  Intake    960 ml  Output   2175 ml  Net  -1215 ml     Assessment:  68 y.o. male is s/p:  Right femoral to posterior tibial bypass with non-reversed ipsilateral great saphenous vein, arteriogram  3 Days Post-Op  Plan: Continue xeroform dressing D/c home today Follow up 2 weeks  Doreatha MassedSamantha Rhyne, PA-C [facility] [phone] 03/10/2014 7:57 AM  Exam details as above 2+ PT pulse D/c home today with home health  Fabienne Brunsharles Jocabed Cheese, MD [facility] [place] Office: [phone] Pager: [phone]

## 2014-03-10 NOTE — Care Management Note (Signed)
    Page 1 of 1   03/10/2014     4:48:17 PM CARE MANAGEMENT NOTE 03/10/2014  Patient:  Nicholas Morrison,Nicholas Morrison   Account Number:  [id number]  Date Initiated:  03/10/2014  Documentation initiated by:  Sujey Gundry  Subjective/Objective Assessment:   Pt adm s/p rt fem-post tibial artery bypass on 03/07/14. PTA, pt independent, lives with spouse.     Action/Plan:   HHRN needed at dc for wound care.  [facility]  Start of care 24-48h post dc date.   Anticipated DC Date:  03/10/2014   Anticipated DC Plan:  HOME W HOME HEALTH SERVICES      DC Planning Services  CM consult      [facility]   Choice offered to / List presented to:  C-1 Patient        HH arranged  HH-1 RN      [facility]  [facility]   Status of service:  Completed, signed off Medicare Important Message given?   (If response is "NO", the following Medicare IM given date fields will be blank) Date Medicare IM given:   Date Additional Medicare IM given:    Discharge Disposition:  HOME W HOME HEALTH SERVICES  Per UR Regulation:  Reviewed for med. necessity/level of care/duration of stay  If discussed at Long Length of Stay Meetings, dates discussed:    Comments:

## 2014-03-10 NOTE — Progress Notes (Signed)
PT Note  Pt for dc home today. Pt feels comfortable with amb and stairs and is ready to return home.  Fluor CorporationCary Anaalicia Nicholas Morrison PT [phone]

## 2014-03-10 NOTE — Progress Notes (Signed)
IV and tele monitor d/c at this time; pt verbalized understanding of d/c instructions; to d/c home with wife; pt given prescriptions; HH set up per CM; pt awaiting wife to arrive; will cont. To monitor.

## 2014-03-23 ENCOUNTER — Encounter: Payer: Self-pay | Admitting: Vascular Surgery

## 2014-03-24 ENCOUNTER — Ambulatory Visit (INDEPENDENT_AMBULATORY_CARE_PROVIDER_SITE_OTHER): Payer: Self-pay | Admitting: Vascular Surgery

## 2014-03-24 ENCOUNTER — Encounter: Payer: Self-pay | Admitting: Vascular Surgery

## 2014-03-24 VITALS — BP 132/70 | HR 72 | Temp 97.8°F | Ht 68.0 in | Wt 145.0 lb

## 2014-03-24 DIAGNOSIS — I70219 Atherosclerosis of native arteries of extremities with intermittent claudication, unspecified extremity: Secondary | ICD-10-CM
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
## 2014-04-27 ENCOUNTER — Encounter: Payer: Self-pay | Admitting: Vascular Surgery

## 2014-04-28 ENCOUNTER — Ambulatory Visit (INDEPENDENT_AMBULATORY_CARE_PROVIDER_SITE_OTHER): Payer: Self-pay | Admitting: Vascular Surgery

## 2014-04-28 ENCOUNTER — Encounter: Payer: Self-pay | Admitting: Vascular Surgery

## 2014-04-28 VITALS — BP 130/77 | HR 74 | Temp 97.8°F | Resp 16 | Ht 69.0 in | Wt 140.0 lb

## 2014-04-28 DIAGNOSIS — I70219 Atherosclerosis of native arteries of extremities with intermittent claudication, unspecified extremity: Secondary | ICD-10-CM

## 2014-04-28 DIAGNOSIS — Z48812 Encounter for surgical aftercare following surgery on the circulatory system: Secondary | ICD-10-CM
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
## 2014-04-28 NOTE — Addendum Note (Signed)
Addended by: Sharee Pimple on: 04/28/2014 04:52 PM   Modules accepted: Orders

## 2014-06-14 ENCOUNTER — Telehealth: Payer: Self-pay | Admitting: Oncology

## 2014-06-14 NOTE — Telephone Encounter (Signed)
S/W PATIENT WIFE AND GAVE NP APPT FOR 08/04 @ 1:30 W/DR. SHADAD.  REFERRING DR. SHAH3 DX- ESOPHAGEAL CA

## 2014-06-14 NOTE — Telephone Encounter (Signed)
C/D 06/14/14 for appt. 06/21/14

## 2014-06-18 DEATH — deceased

## 2014-06-21 ENCOUNTER — Other Ambulatory Visit: Payer: Medicare Other

## 2014-06-21 ENCOUNTER — Ambulatory Visit: Payer: Medicare Other

## 2014-06-21 ENCOUNTER — Telehealth: Payer: Self-pay | Admitting: Vascular Surgery

## 2014-06-21 ENCOUNTER — Ambulatory Visit: Payer: Medicare Other | Admitting: Oncology

## 2014-06-23 ENCOUNTER — Encounter (HOSPITAL_COMMUNITY): Payer: Medicare Other

## 2014-06-23 ENCOUNTER — Other Ambulatory Visit (HOSPITAL_COMMUNITY): Payer: Medicare Other

## 2014-06-23 ENCOUNTER — Ambulatory Visit: Payer: Medicare Other | Admitting: Family

## 2014-06-23 NOTE — Telephone Encounter (Signed)
Confirmed date of death was October 09, 2014 per obituary.

## 2014-09-21 IMAGING — CR DG FOOT COMPLETE 3+V*R*
3 series · 3 of 3 positions shown · non-contrast
Comparison: None.

CLINICAL DATA: Foot pain and blistering/rash after podiatric
procedure.

EXAM:
RIGHT FOOT COMPLETE - 3+ VIEW

[t foot ap right]
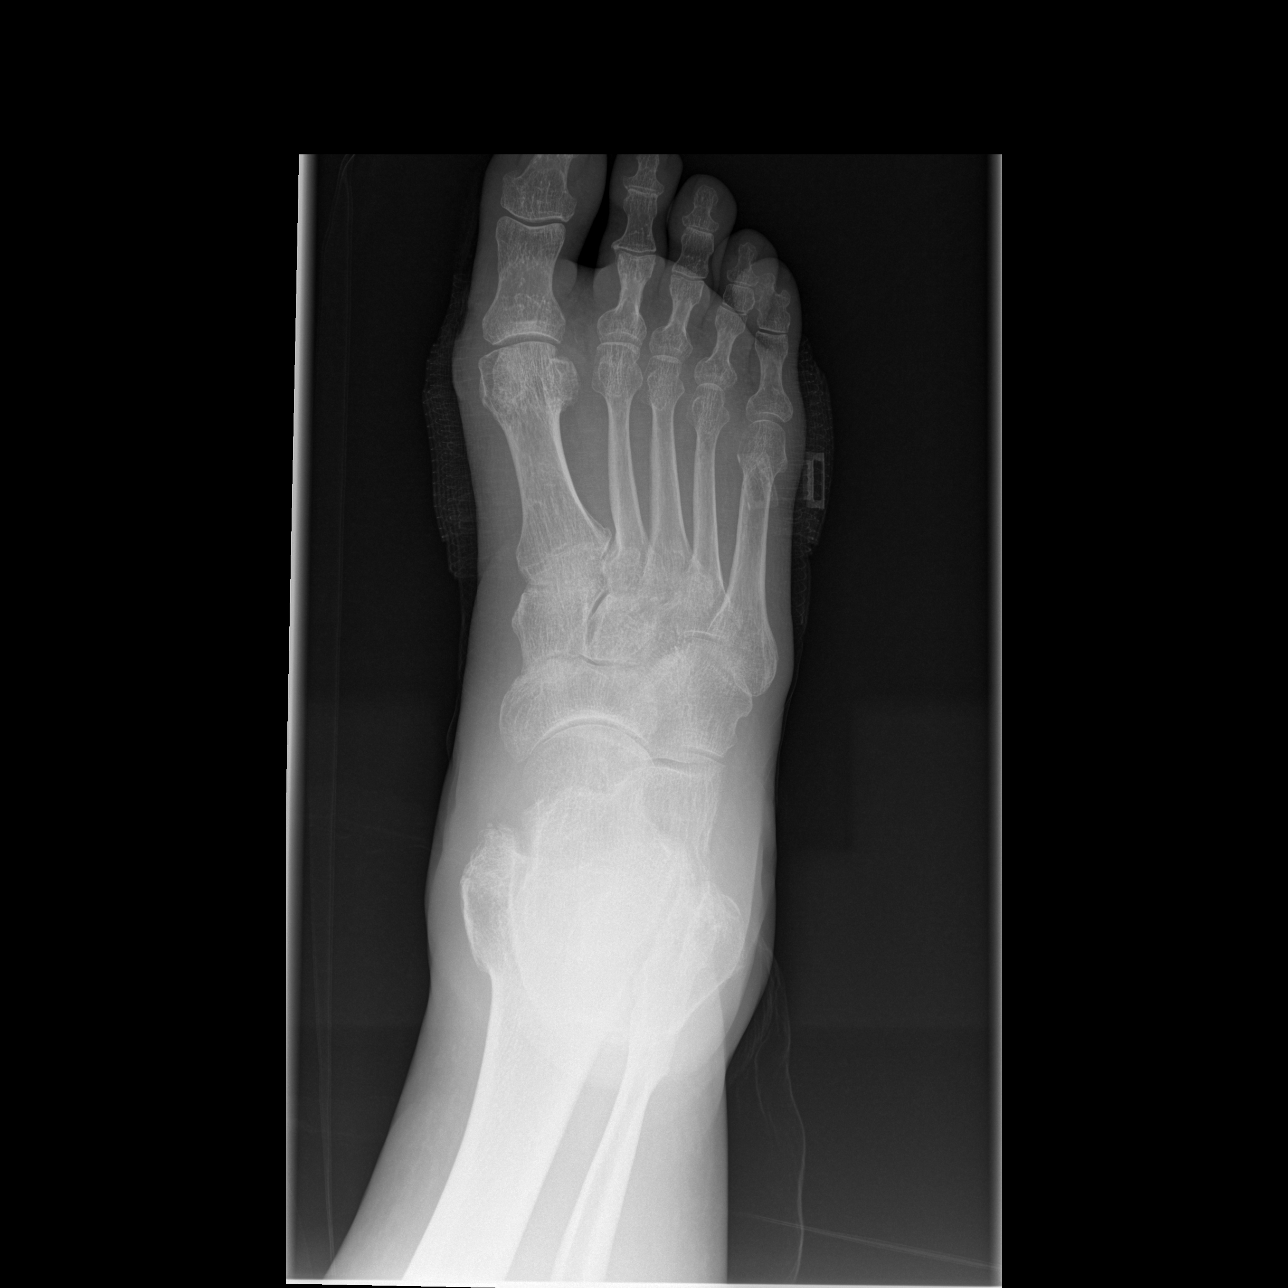

[t foot oblique right]
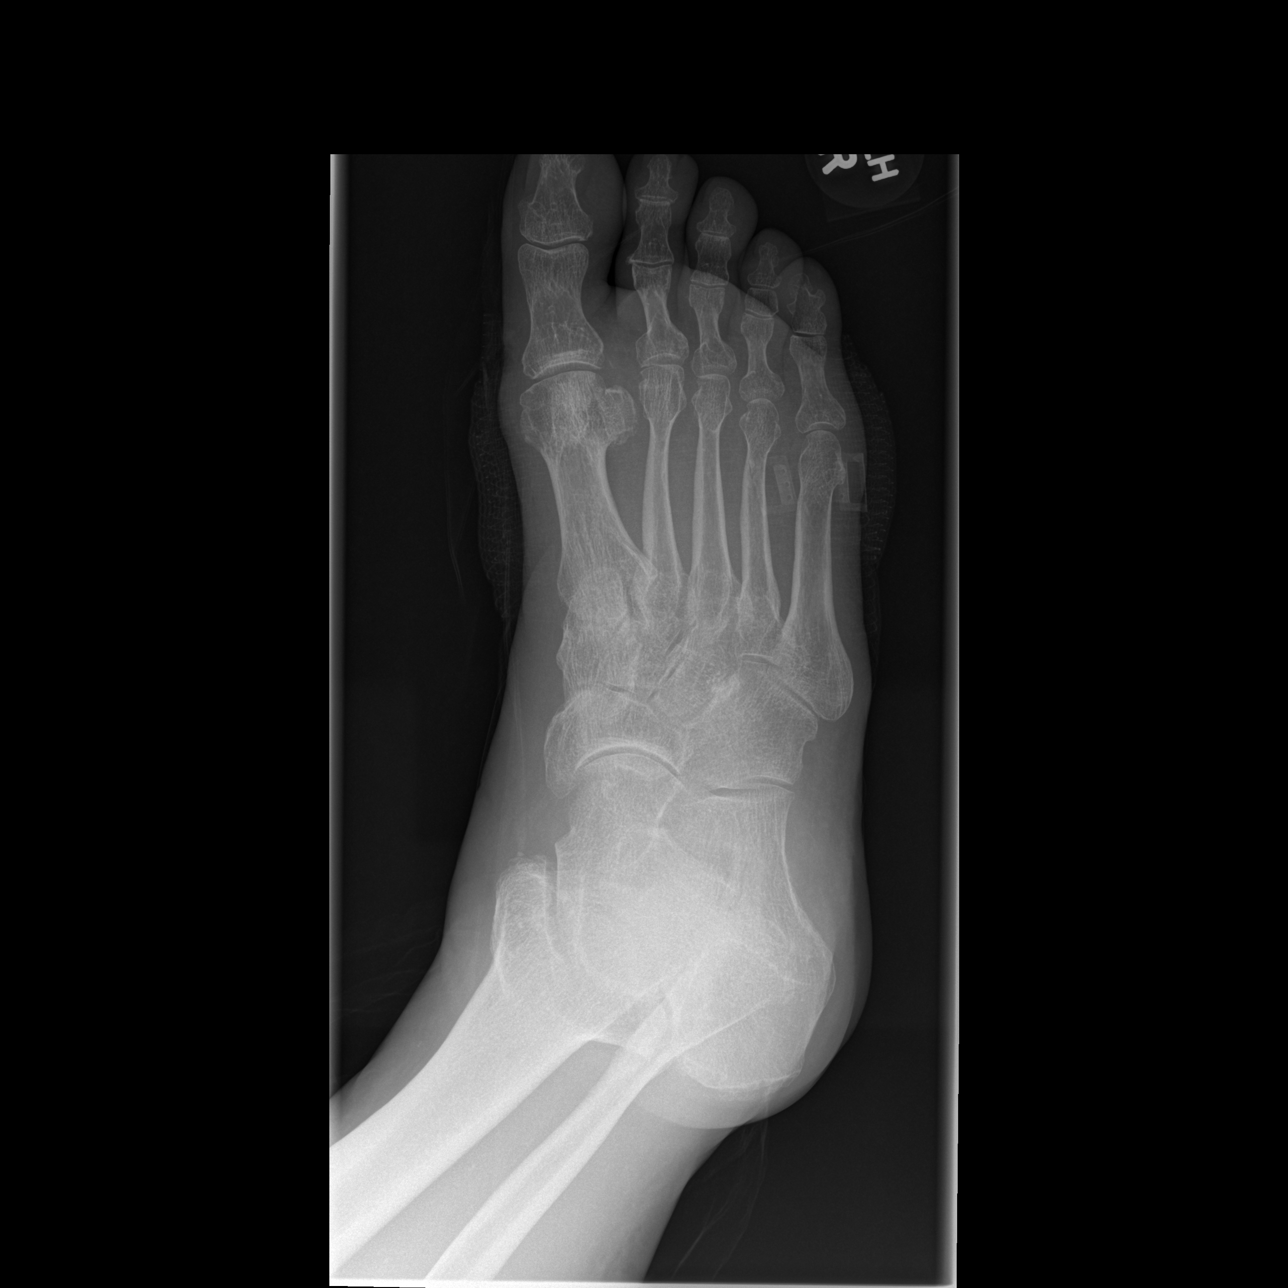

[t foot lat right]
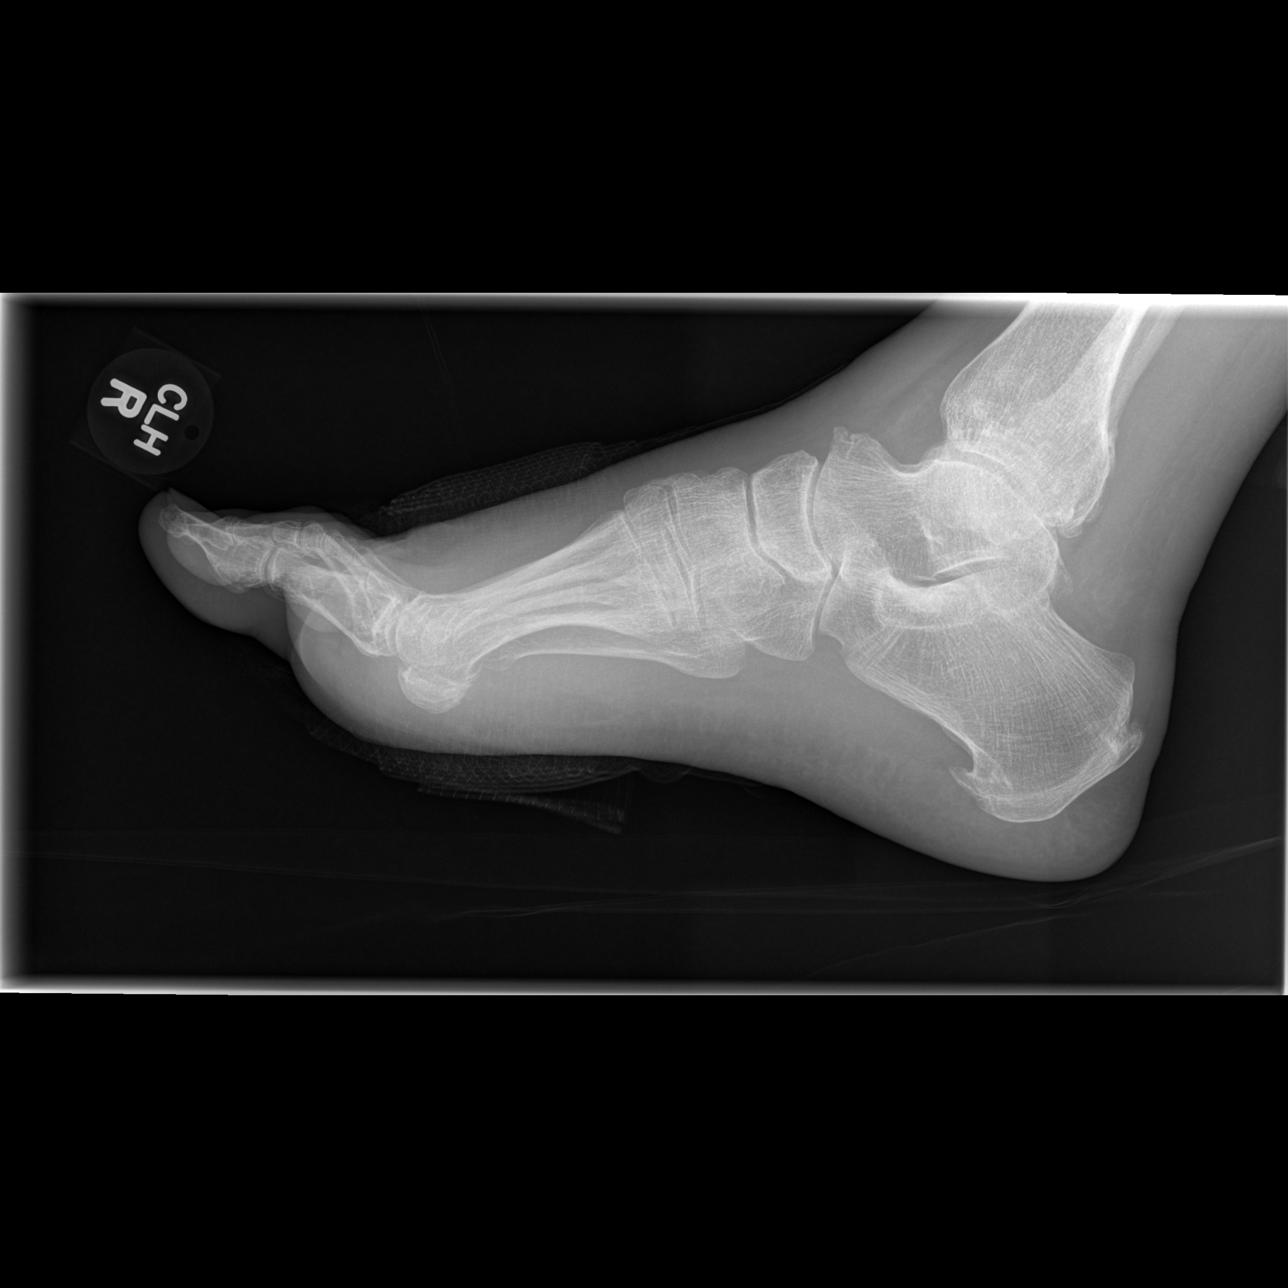

[3 of 3 positions shown; findings below may reference images not displayed]

FINDINGS: No fracture, dislocation or bony destruction is identified. Soft
tissues show no foreign body or gas. Mild degenerative changes are
seen involving the tarsal bones.
IMPRESSION: No acute findings.

## 2014-10-18 NOTE — Telephone Encounter (Signed)
[facility] Nicholas GailsJennifer L Arrington

## 2014-10-27 ENCOUNTER — Encounter (HOSPITAL_COMMUNITY): Payer: Self-pay | Admitting: Vascular Surgery

## 2014-11-06 HISTORY — PX: ABDOMINAL AORTAGRAM: SHX5706
